# Patient Record
Sex: Female | Born: 1990 | Race: White | Hispanic: No
Health system: Midwestern US, Academic
[De-identification: ages and names within clinical notes are randomized; demographics above are authoritative.]

---

## 2015-05-22 ENCOUNTER — Emergency Department (HOSPITAL_COMMUNITY): Payer: Self-pay

## 2015-05-22 ENCOUNTER — Emergency Department (HOSPITAL_COMMUNITY)
Admission: EM | Admit: 2015-05-22 | Discharge: 2015-05-22 | Disposition: A | Discharge status: Home / Self Care | Payer: Self-pay | Attending: Emergency Medicine | Admitting: Emergency Medicine

## 2015-05-22 ENCOUNTER — Encounter (HOSPITAL_COMMUNITY): Payer: Self-pay | Admitting: Oncology

## 2015-05-22 DIAGNOSIS — Y990 Civilian activity done for income or pay: Secondary | ICD-10-CM | POA: Insufficient documentation

## 2015-05-22 DIAGNOSIS — Y9389 Activity, other specified: Secondary | ICD-10-CM | POA: Insufficient documentation

## 2015-05-22 DIAGNOSIS — W228XXA Striking against or struck by other objects, initial encounter: Secondary | ICD-10-CM | POA: Insufficient documentation

## 2015-05-22 DIAGNOSIS — S0083XA Contusion of other part of head, initial encounter: Secondary | ICD-10-CM | POA: Insufficient documentation

## 2015-05-22 DIAGNOSIS — Y9289 Other specified places as the place of occurrence of the external cause: Secondary | ICD-10-CM | POA: Insufficient documentation

## 2015-05-22 MED ORDER — IBUPROFEN 600 MG PO TABS: 600.0000 mg | ORAL_TABLET | Freq: Four times a day (QID) | ORAL | Status: DC | PRN

## 2015-05-22 MED ORDER — IBUPROFEN 800 MG PO TABS
800.0000 mg | ORAL_TABLET | Freq: Once | ORAL | Status: AC
  Administered 2015-05-22: 800 mg via ORAL
  Filled 2015-05-22: qty 1

## 2015-05-22 NOTE — ED Provider Notes (Signed)
CSN: [id number]     Arrival date & time 05/22/15  2003 History  By signing my name below, I, Lyndel Safe, attest that this documentation has been prepared under the direction and in the presence of Elpidio Anis, PA-C.  Electronically Signed: Lyndel Safe, ED Scribe. 05/22/2015. 9:47 PM.   Chief Complaint  Patient presents with  . Nose pain    The history is provided by the patient. No language interpreter was used.   HPI Comments: Sandra Carney is a 25 y.o. female, with no chronic medical conditions, who presents to the Emergency Department complaining of intermittent, 7/10 pain to bridge of nose s/p injury that occurred ~ 3 hours ago. Pt reports a pallet slid partially off a truck and hit her in the nose. She associates right nares epistaxis at time of injury which has resolved. She denies LOC, malocclusion, visual disturbances, or pain with EOMs. Pt is not taking blood thinning medication. No h/o tobacco abuse.   History reviewed. No pertinent past medical history. History reviewed. No pertinent past surgical history. History reviewed. No pertinent family history. Social History  Substance Use Topics  . Smoking status: Never Smoker   . Smokeless tobacco: Never Used  . Alcohol Use: No   OB History    No data available     Review of Systems  HENT: Negative for dental problem.   Eyes: Negative for pain and visual disturbance.  Musculoskeletal: Positive for arthralgias ( bridge of nose).  Neurological: Negative for syncope.  Hematological: Does not bruise/bleed easily.   Allergies  Review of patient's allergies indicates no known allergies.  Home Medications   Prior to Admission medications   Not on File   BP 131/68 mmHg  Pulse 83  Temp(Src) 98.8 F (37.1 C) (Oral)  Resp 18  SpO2 100%  LMP 04/20/2015 (Approximate) Physical Exam  Constitutional: She is oriented to person, place, and time. She appears well-developed and well-nourished. No distress.  HENT:  Head:  Normocephalic.  Minimal swelling at the bridge of the nose without deviation or deformity. Nares patent bilaterally. No septal hematoma visualized. No epistaxis. No other facial bone tenderness. No malocclusion.  Eyes: Conjunctivae and EOM are normal. Pupils are equal, round, and reactive to light.  No pain with full eye movement bilaterally.   Neck: Normal range of motion. Neck supple.  Cardiovascular: Normal rate.   Pulmonary/Chest: Effort normal. No respiratory distress.  Musculoskeletal: Normal range of motion.  No midline cervical tenderness.   Neurological: She is alert and oriented to person, place, and time. Coordination normal.  Skin: Skin is warm.  Psychiatric: She has a normal mood and affect. Her behavior is normal.  Nursing note and vitals reviewed.   ED Course  Procedures  DIAGNOSTIC STUDIES: Oxygen Saturation is 100% on RA, normal by my interpretation.    COORDINATION OF CARE: 9:44 PM Discussed treatment plan with pt at bedside to order Xray of nasal bones. Pt agreeable to plan.   Imaging Review Dg Nasal Bones  05/22/2015  CLINICAL DATA:  25 year old female with trauma the nose EXAM: NASAL BONES - 3+ VIEW COMPARISON:  None. FINDINGS: There is no evidence of fracture or other bone abnormality. IMPRESSION: No fracture. Electronically Signed   By: Elgie Collard M.D.   On: 05/22/2015 22:03   I have personally reviewed and evaluated these images results as part of my medical decision-making.   MDM   Final diagnoses:  None    1. Facial contusion  No fracture on nasal plain  film. She is otherwise NAD. Supportive care recommended.   I personally performed the services described in this documentation, which was scribed in my presence. The recorded information has been reviewed and is accurate.     Elpidio Anis, PA-C 05/22/15 2234  Mancel Bale, MD 05/22/15 [phone]

## 2015-05-22 NOTE — ED Notes (Signed)
Workers comp drug screen completed.

## 2015-05-22 NOTE — ED Notes (Signed)
Pt presents d/t an injury at work.  Per pt she had a pallet slide partially off a truck and hit her in the nose.  No obvious deformity.  Pt rates pain 7/10.  Nose did bleed at first per pt however no bleeding noted now.

## 2015-05-22 NOTE — Discharge Instructions (Signed)
Facial or Scalp Contusion °A facial or scalp contusion is a deep bruise on the face or head. Injuries to the face and head generally cause a lot of swelling, especially around the eyes. Contusions are the result of an injury that caused bleeding under the skin. The contusion may turn blue, purple, or yellow. Minor injuries will give you a painless contusion, but more severe contusions may stay painful and swollen for a few weeks.  °CAUSES  °A facial or scalp contusion is caused by a blunt injury or trauma to the face or head area.  °SIGNS AND SYMPTOMS  °· Swelling of the injured area.   °· Discoloration of the injured area.   °· Tenderness, soreness, or pain in the injured area.   °DIAGNOSIS  °The diagnosis can be made by taking a medical history and doing a physical exam. An X-ray exam, CT scan, or MRI may be needed to determine if there are any associated injuries, such as broken bones (fractures). °TREATMENT  °Often, the best treatment for a facial or scalp contusion is applying cold compresses to the injured area. Over-the-counter medicines may also be recommended for pain control.  °HOME CARE INSTRUCTIONS  °· Only take over-the-counter or prescription medicines as directed by your health care provider.   °· Apply ice to the injured area.   °· Put ice in a plastic bag.   °· Place a towel between your skin and the bag.   °· Leave the ice on for 20 minutes, 2-3 times a day.   °SEEK MEDICAL CARE IF: °· You have bite problems.   °· You have pain with chewing.   °· You are concerned about facial defects. °SEEK IMMEDIATE MEDICAL CARE IF: °· You have severe pain or a headache that is not relieved by medicine.   °· You have unusual sleepiness, confusion, or personality changes.   °· You throw up (vomit).   °· You have a persistent nosebleed.   °· You have double vision or blurred vision.   °· You have fluid drainage from your nose or ear.   °· You have difficulty walking or using your arms or legs.   °MAKE SURE YOU:   °· Understand these instructions. °· Will watch your condition. °· Will get help right away if you are not doing well or get worse. °  °This information is not intended to replace advice given to you by your health care provider. Make sure you discuss any questions you have with your health care provider. °  °Document Released: 05/25/2004 Document Revised: 05/08/2014 Document Reviewed: 11/28/2012 °Elsevier Interactive Patient Education ©2016 Elsevier Inc. ° °Cryotherapy °Cryotherapy means treatment with cold. Ice or gel packs can be used to reduce both pain and swelling. Ice is the most helpful within the first 24 to 48 hours after an injury or flare-up from overusing a muscle or joint. Sprains, strains, spasms, burning pain, shooting pain, and aches can all be eased with ice. Ice can also be used when recovering from surgery. Ice is effective, has very few side effects, and is safe for most people to use. °PRECAUTIONS  °Ice is not a safe treatment option for people with: °· Raynaud phenomenon. This is a condition affecting small blood vessels in the extremities. Exposure to cold may cause your problems to return. °· Cold hypersensitivity. There are many forms of cold hypersensitivity, including: °¨ Cold urticaria. Red, itchy hives appear on the skin when the tissues begin to warm after being iced. °¨ Cold erythema. This is a red, itchy rash caused by exposure to cold. °¨ Cold hemoglobinuria. Red blood   cells break down when the tissues begin to warm after being iced. The hemoglobin that carry oxygen are passed into the urine because they cannot combine with blood proteins fast enough. °· Numbness or altered sensitivity in the area being iced. °If you have any of the following conditions, do not use ice until you have discussed cryotherapy with your caregiver: °· Heart conditions, such as arrhythmia, angina, or chronic heart disease. °· High blood pressure. °· Healing wounds or open skin in the area being  iced. °· Current infections. °· Rheumatoid arthritis. °· Poor circulation. °· Diabetes. °Ice slows the blood flow in the region it is applied. This is beneficial when trying to stop inflamed tissues from spreading irritating chemicals to surrounding tissues. However, if you expose your skin to cold temperatures for too long or without the proper protection, you can damage your skin or nerves. Watch for signs of skin damage due to cold. °HOME CARE INSTRUCTIONS °Follow these tips to use ice and cold packs safely. °· Place a dry or damp towel between the ice and skin. A damp towel will cool the skin more quickly, so you may need to shorten the time that the ice is used. °· For a more rapid response, add gentle compression to the ice. °· Ice for no more than 10 to 20 minutes at a time. The bonier the area you are icing, the less time it will take to get the benefits of ice. °· Check your skin after 5 minutes to make sure there are no signs of a poor response to cold or skin damage. °· Rest 20 minutes or more between uses. °· Once your skin is numb, you can end your treatment. You can test numbness by very lightly touching your skin. The touch should be so light that you do not see the skin dimple from the pressure of your fingertip. When using ice, most people will feel these normal sensations in this order: cold, burning, aching, and numbness. °· Do not use ice on someone who cannot communicate their responses to pain, such as small children or people with dementia. °HOW TO MAKE AN ICE PACK °Ice packs are the most common way to use ice therapy. Other methods include ice massage, ice baths, and cryosprays. Muscle creams that cause a cold, tingly feeling do not offer the same benefits that ice offers and should not be used as a substitute unless recommended by your caregiver. °To make an ice pack, do one of the following: °· Place crushed ice or a bag of frozen vegetables in a sealable plastic bag. Squeeze out the excess  air. Place this bag inside another plastic bag. Slide the bag into a pillowcase or place a damp towel between your skin and the bag. °· Mix 3 parts water with 1 part rubbing alcohol. Freeze the mixture in a sealable plastic bag. When you remove the mixture from the freezer, it will be slushy. Squeeze out the excess air. Place this bag inside another plastic bag. Slide the bag into a pillowcase or place a damp towel between your skin and the bag. °SEEK MEDICAL CARE IF: °· You develop white spots on your skin. This may give the skin a blotchy (mottled) appearance. °· Your skin turns blue or pale. °· Your skin becomes waxy or hard. °· Your swelling gets worse. °MAKE SURE YOU:  °· Understand these instructions. °· Will watch your condition. °· Will get help right away if you are not doing well or get worse. °  °  This information is not intended to replace advice given to you by your health care provider. Make sure you discuss any questions you have with your health care provider. °  °Document Released: 12/12/2010 Document Revised: 05/08/2014 Document Reviewed: 12/12/2010 °Elsevier Interactive Patient Education ©2016 Elsevier Inc. ° °

## 2016-12-15 IMAGING — CR DG NASAL BONES 3+V
3 series · 3 of 3 positions shown · non-contrast
Comparison: None.

CLINICAL DATA: 24-year-old female with trauma the nose

EXAM:
NASAL BONES - 3+ VIEW

[w waters pa]
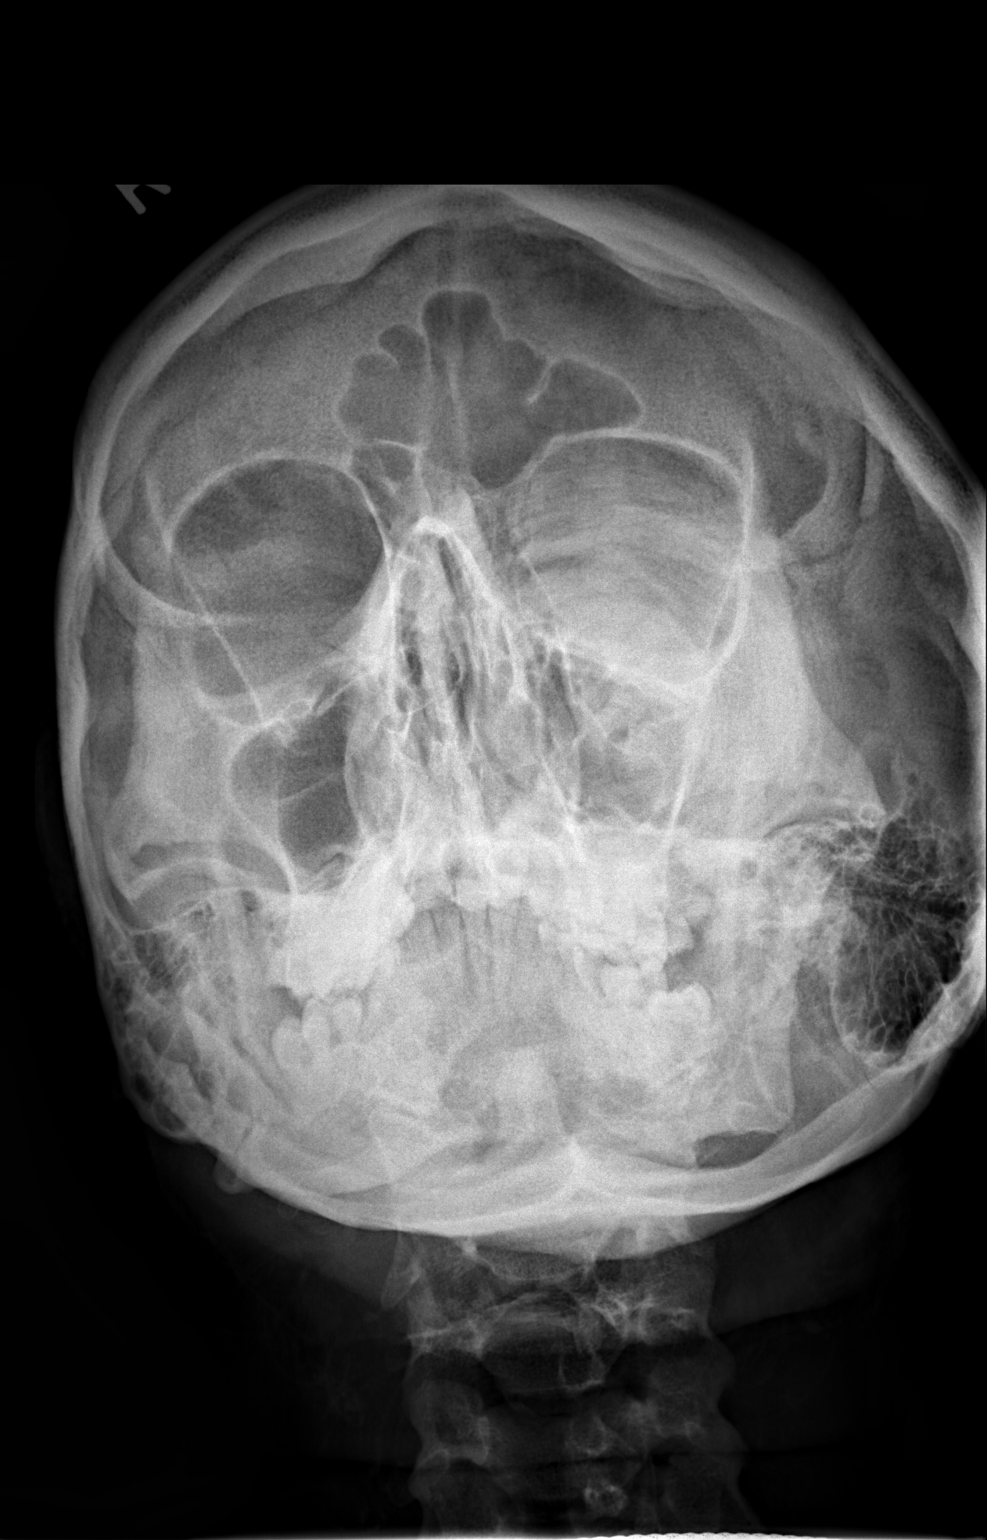

[w nasal bone lat (1 of 2)]
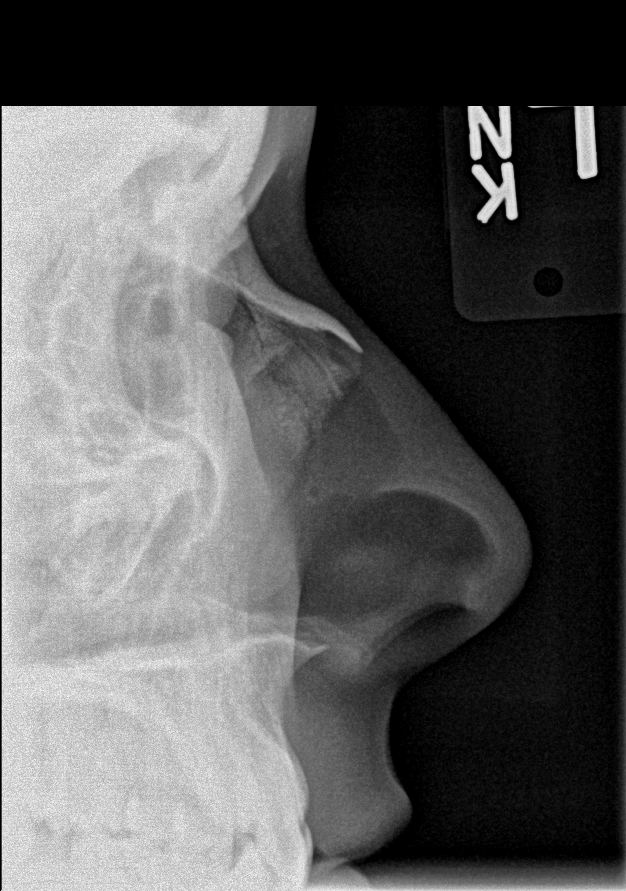

[w nasal bone lat (2 of 2)]
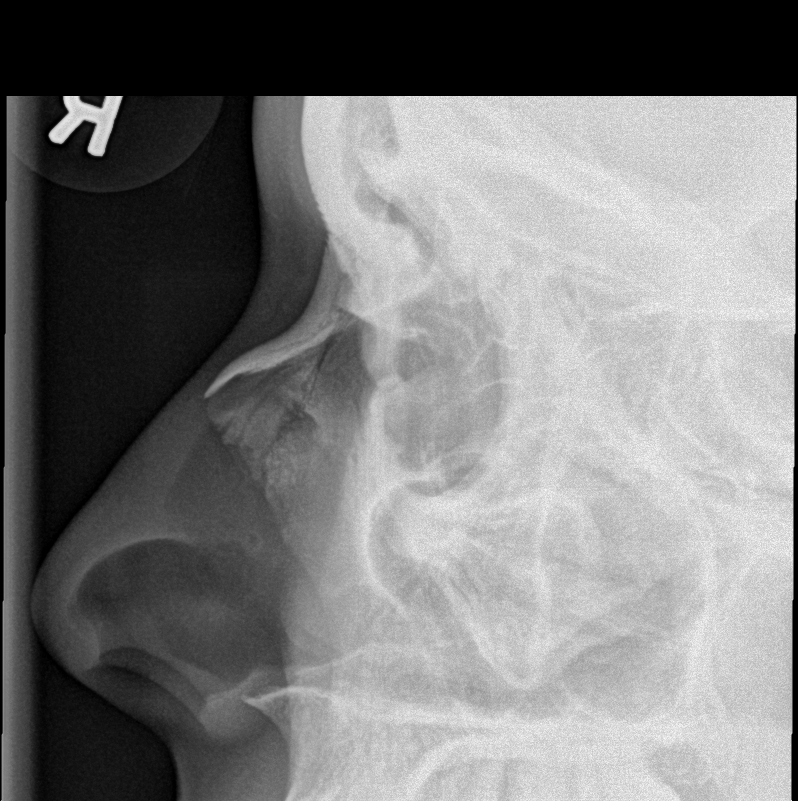

[3 of 3 positions shown; findings below may reference images not displayed]

FINDINGS: There is no evidence of fracture or other bone abnormality.
IMPRESSION: No fracture.

## 2016-12-17 ENCOUNTER — Encounter (HOSPITAL_COMMUNITY): Payer: Self-pay | Admitting: Emergency Medicine

## 2016-12-17 ENCOUNTER — Emergency Department (HOSPITAL_COMMUNITY)
Admission: EM | Admit: 2016-12-17 | Discharge: 2016-12-17 | Disposition: A | Discharge status: Home / Self Care | Payer: Self-pay | Attending: Emergency Medicine | Admitting: Emergency Medicine

## 2016-12-17 DIAGNOSIS — L239 Allergic contact dermatitis, unspecified cause: Secondary | ICD-10-CM | POA: Insufficient documentation

## 2016-12-17 MED ORDER — PREDNISONE 10 MG PO TABS: ORAL_TABLET | ORAL | 0 refills | Status: DC

## 2016-12-17 NOTE — Discharge Instructions (Signed)
Return if any problems.

## 2016-12-17 NOTE — ED Notes (Signed)
Patient was alert, oriented and stable upon discharge. RN went over AVS and patient had no further questions.  

## 2016-12-17 NOTE — ED Provider Notes (Signed)
[facility]-EMERGENCY DEPT Provider Note   CSN: [id number] Arrival date & time: 12/17/16  1731  By signing my name below, I, Ny'Kea Lewis, attest that this documentation has been prepared under the direction and in the presence of Langston Masker, [place]. Electronically Signed: Karren Cobble, ED Scribe. 12/17/16. 7:07 PM.  History   Chief Complaint No chief complaint on file.  The history is provided by the patient. No language interpreter was used.    HPI Comments: Sandra Carney is a 26 y.o. female with no pertinent history, who presents to the Emergency Department complaining of sudden onset, mild diffuse itching and areas of redness to her feet. 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 Pt states she has tried several topical creams and rubbing alcohol to with no improvement of her symptoms. Denies fever, chills, or any other complaints.   History reviewed. No pertinent past medical history.  There are no active problems to display for this patient.  History reviewed. No pertinent surgical history.  OB History    No data available     Home Medications    Prior to Admission medications   Medication Sig Start Date End Date Taking? Authorizing Provider  ibuprofen (ADVIL,MOTRIN) 600 MG tablet Take 1 tablet (600 mg total) by mouth every 6 (six) hours as needed. 05/22/15   Elpidio Anis, PA-C   Family History No family history on file.  Social History Social History  Substance Use Topics  . Smoking status: Never Smoker  . Smokeless tobacco: Never Used  . Alcohol use No   Allergies   Patient has no known allergies.  Review of Systems Review of Systems  Constitutional: Negative for chills and fever.  Skin: Positive for rash.  All other systems reviewed and are negative.   Physical Exam Updated Vital Signs BP 121/70 (BP Location: Right Arm)   Pulse 70   Temp 97.8 F (36.6 C) (Oral)   Resp 18   Ht 5\' 6"  (1.676 m)   Wt 180 lb (81.6 kg)    LMP 12/10/2016   SpO2 100%   BMI 29.05 kg/m   Physical Exam  Constitutional: She is oriented to person, place, and time. She appears well-developed and well-nourished.  HENT:  Head: Normocephalic.  Eyes: EOM are normal.  Neck: Normal range of motion.  Pulmonary/Chest: Effort normal.  Abdominal: She exhibits no distension.  Musculoskeletal: Normal range of motion.  Neurological: She is alert and oriented to person, place, and time.  Skin: There is erythema.  Multiple scattered erythematous raised pimples.   Psychiatric: She has a normal mood and affect.  Nursing note and vitals reviewed.  ED Treatments / Results  DIAGNOSTIC STUDIES: Oxygen Saturation is 100% on RA, normal by my interpretation.  COORDINATION OF CARE: 6:57 PM-Discussed next steps with pt. Pt verbalized understanding and is agreeable with the plan.   Labs (all labs ordered are listed, but only abnormal results are displayed) Labs Reviewed - No data to display  EKG  EKG Interpretation None       Radiology No results found.  Procedures Procedures (including critical care time)  Medications Ordered in ED Medications - No data to display   Initial Impression / Assessment and Plan / ED Course  I have reviewed the triage vital signs and the nursing notes.  Pertinent labs & imaging results that were available during my care of the patient were reviewed by me and considered in my medical decision making (see chart for details).  Final Clinical Impressions(s) / ED Diagnoses   Final diagnoses:  Allergic dermatitis    New Prescriptions Discharge Medication List as of 12/17/2016  7:10 PM    START taking these medications   Details  predniSONE (DELTASONE) 10 MG tablet 6,5,4,3,2,1 taper, Print      An After Visit Summary was printed and given to the patient.  I personally performed the services in this documentation, which was scribed in my presence.  The recorded information has been  reviewed and considered.   Barnet Pall.   Elson Areas, [place] 12/17/16 2233    Geoffery Lyons, MD 12/17/16 2259

## 2016-12-17 NOTE — ED Triage Notes (Addendum)
Pt comes in with complaints of a rash present on both of her feet without spreading to her legs. Did have some on her arms but those are healing.  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  Pt has tried several topical creams without relief.

## 2017-01-17 ENCOUNTER — Ambulatory Visit (INDEPENDENT_AMBULATORY_CARE_PROVIDER_SITE_OTHER): Payer: Self-pay

## 2017-01-17 ENCOUNTER — Ambulatory Visit (HOSPITAL_COMMUNITY)
Admission: EM | Admit: 2017-01-17 | Discharge: 2017-01-17 | Disposition: A | Discharge status: Home / Self Care | Payer: Self-pay | Attending: Family Medicine | Admitting: Family Medicine

## 2017-01-17 ENCOUNTER — Encounter (HOSPITAL_COMMUNITY): Payer: Self-pay | Admitting: Family Medicine

## 2017-01-17 DIAGNOSIS — S9031XA Contusion of right foot, initial encounter: Secondary | ICD-10-CM

## 2017-01-17 NOTE — ED Provider Notes (Signed)
  [facility]   [id number] 01/17/17 Arrival Time: 1620  ASSESSMENT & PLAN:  1. Contusion of right foot, initial encounter    Post op shoe until she can tolerate a normal shoe. Activity as tolerated. Ibuprofen with food as needed. May f/u as needed. Reviewed expectations re: course of current medical issues. Questions answered. Outlined signs and symptoms indicating need for more acute intervention. Patient verbalized understanding. After Visit Summary given.   SUBJECTIVE:  Sandra Carney is a 26 y.o. female who presents with complaint of an injury to her R foot yesterday evening. Heavy cart rolled onto foot. Able to pull foot free. Continued with ambulation. Gradual onset of discomfort near great toe. Bruised this morning. Still ambulatory. Can wear a flip-flop. Ibuprofen helps. No sensation changes.  ROS: As per HPI.   OBJECTIVE:  Vitals:   01/17/17 1653  BP: 124/84  Pulse: 76  Resp: 18  Temp: 98.4 F (36.9 C)  SpO2: 100%    General appearance: alert; no distress Extremities: swelling of R foot at great toe; tender to palpation that is poorly localized; normal capillary refill; normal sensation Skin: bruised over dorsal R foot just proximal to great toe Psychological: alert and cooperative; normal mood and affect  Imaging: Dg Foot Complete Right  Result Date: 01/17/2017 CLINICAL DATA:  Injury, pain EXAM: RIGHT FOOT COMPLETE - 3+ VIEW COMPARISON:  None. FINDINGS: There is no evidence of fracture or dislocation. There is no evidence of arthropathy or other focal bone abnormality. Soft tissues are unremarkable. IMPRESSION: Negative. Electronically Signed   By: Judie Petit.  Shick M.D.   On: 01/17/2017 17:13    No Known Allergies  Social History   Social History  . Marital status: Single    Spouse name: N/A  . Number of children: N/A  . Years of education: N/A   Occupational History  . Not on file.   Social History Main Topics  . Smoking status: Never Smoker  .  Smokeless tobacco: Never Used  . Alcohol use No  . Drug use: No  . Sexual activity: Yes    Birth control/ protection: None   Other Topics Concern  . Not on file   Social History Narrative  . No narrative on file      Mardella Layman, MD 01/17/17 1728

## 2017-01-17 NOTE — ED Triage Notes (Signed)
Pt here for right foot pain around and right great toe. Swelling and bruising noted. sts injured at a concert last night

## 2017-01-17 NOTE — Discharge Instructions (Signed)
Wear shoe until you can tolerate a normal shoe. Use ibuprofen and Tylenol as needed.

## 2017-06-06 ENCOUNTER — Emergency Department (HOSPITAL_COMMUNITY)
Admission: EM | Admit: 2017-06-06 | Discharge: 2017-06-06 | Disposition: A | Discharge status: Home / Self Care | Payer: Self-pay | Attending: Emergency Medicine | Admitting: Emergency Medicine

## 2017-06-06 ENCOUNTER — Encounter (HOSPITAL_COMMUNITY): Payer: Self-pay | Admitting: *Deleted

## 2017-06-06 DIAGNOSIS — M62830 Muscle spasm of back: Secondary | ICD-10-CM | POA: Insufficient documentation

## 2017-06-06 DIAGNOSIS — S39012A Strain of muscle, fascia and tendon of lower back, initial encounter: Secondary | ICD-10-CM | POA: Insufficient documentation

## 2017-06-06 DIAGNOSIS — Y9389 Activity, other specified: Secondary | ICD-10-CM | POA: Insufficient documentation

## 2017-06-06 DIAGNOSIS — Y929 Unspecified place or not applicable: Secondary | ICD-10-CM | POA: Insufficient documentation

## 2017-06-06 DIAGNOSIS — X500XXA Overexertion from strenuous movement or load, initial encounter: Secondary | ICD-10-CM | POA: Insufficient documentation

## 2017-06-06 DIAGNOSIS — Y999 Unspecified external cause status: Secondary | ICD-10-CM | POA: Insufficient documentation

## 2017-06-06 MED ORDER — CYCLOBENZAPRINE HCL 10 MG PO TABS: 10.0000 mg | ORAL_TABLET | Freq: Two times a day (BID) | ORAL | 0 refills | Status: AC | PRN

## 2017-06-06 MED ORDER — NAPROXEN 500 MG PO TABS: 500.0000 mg | ORAL_TABLET | Freq: Two times a day (BID) | ORAL | 0 refills | Status: AC

## 2017-06-06 MED ORDER — CYCLOBENZAPRINE HCL 10 MG PO TABS
10.0000 mg | ORAL_TABLET | Freq: Once | ORAL | Status: AC
  Administered 2017-06-06: 10 mg via ORAL
  Filled 2017-06-06: qty 1

## 2017-06-06 NOTE — ED Triage Notes (Signed)
Pt complains of lower back pain for the past 2 days since picking up heavy logs.   Pt tried motrin and heating pads, which she states provided some relief.

## 2017-06-06 NOTE — ED Provider Notes (Signed)
[facility] Provider Note   CSN: [id number] Arrival date & time: 06/06/17  0856     History   Chief Complaint Chief Complaint  Patient presents with  . Back Pain    HPI Sandra Carney is a 27 y.o. female who presents to the ED with low back pain that started 2 days ago after picking up heavy logs. Patient reports taking motrin and using a heating pad that did help some. Patient denies loss of control of bladder or bowels. Patient denies UTI symptoms, n/v or any other problems.   HPI  History reviewed. No pertinent past medical history.  There are no active problems to display for this patient.   History reviewed. No pertinent surgical history.  OB History    No data available       Home Medications    Prior to Admission medications   Medication Sig Start Date End Date Taking? Authorizing Provider  cyclobenzaprine (FLEXERIL) 10 MG tablet Take 1 tablet (10 mg total) by mouth 2 (two) times daily as needed for muscle spasms. 06/06/17   Janne Napoleon, NP  naproxen (NAPROSYN) 500 MG tablet Take 1 tablet (500 mg total) by mouth 2 (two) times daily. 06/06/17   Janne Napoleon, NP    Family History No family history on file.  Social History Social History   Tobacco Use  . Smoking status: Never Smoker  . Smokeless tobacco: Never Used  Substance Use Topics  . Alcohol use: No  . Drug use: No     Allergies   Patient has no known allergies.   Review of Systems Review of Systems  Musculoskeletal: Positive for arthralgias and back pain.  All other systems reviewed and are negative.    Physical Exam Updated Vital Signs BP 127/79 (BP Location: Right Arm)   Pulse (!) 58   Temp 97.8 F (36.6 C) (Oral)   Resp 18   SpO2 100%   Physical Exam  Constitutional: She appears well-developed and well-nourished. No distress.  HENT:  Head: Normocephalic and atraumatic.  Nose: Nose normal.  Eyes: EOM are normal.  Neck: Normal range of  motion. Neck supple.  Cardiovascular: Normal rate and regular rhythm.  Pulmonary/Chest: Effort normal. She has no wheezes. She has no rales.  Abdominal: Soft. There is no tenderness.  Musculoskeletal:       Lumbar back: She exhibits tenderness, pain and spasm. She exhibits normal pulse. Decreased range of motion: due to pain.       Back:  Neurological: She is alert. She has normal strength. No sensory deficit. Gait normal.  Reflex Scores:      Bicep reflexes are 2+ on the right side and 2+ on the left side.      Brachioradialis reflexes are 2+ on the right side and 2+ on the left side.      Patellar reflexes are 2+ on the right side and 2+ on the left side. Skin: Skin is warm and dry.  Psychiatric: She has a normal mood and affect. Her behavior is normal.  Nursing note and vitals reviewed.    ED Treatments / Results  Labs (all labs ordered are listed, but only abnormal results are displayed) Labs Reviewed - No data to display Radiology No results found.  Procedures Procedures (including critical care time)  Medications Ordered in ED Medications  cyclobenzaprine (FLEXERIL) tablet 10 mg (10 mg Oral Given 06/06/17 1250)     Initial Impression / Assessment and Plan / ED Course  I have reviewed the triage vital signs and the nursing notes. Patient with back pain.  No neurological deficits and normal neuro exam.  Patient can walk but states is painful.  No loss of bowel or bladder control.  No concern for cauda equina.  No fever, night sweats, weight loss, h/o cancer, IVDU.  RICE protocol and pain medicine indicated and discussed with patient.   Final Clinical Impressions(s) / ED Diagnoses   Final diagnoses:  Muscle spasm of back  Lumbosacral strain, initial encounter    ED Discharge Orders        Ordered    naproxen (NAPROSYN) 500 MG tablet  2 times daily     06/06/17 1314    cyclobenzaprine (FLEXERIL) 10 MG tablet  2 times daily PRN     06/06/17 1314       Damian Leavelleese, [place]  M, NP 06/06/17 1321    Bethann BerkshireZammit, Joseph, MD 06/06/17 1626

## 2017-06-06 NOTE — Discharge Instructions (Signed)
Do not take the muscle relaxer while driving as it will make you sleepy. Follow up with your doctor or return here if symptoms worsen.

## 2018-08-13 IMAGING — DX DG FOOT COMPLETE 3+V*R*
3 series · 3 of 3 positions shown · non-contrast
Comparison: None.

CLINICAL DATA: Injury, pain

EXAM:
RIGHT FOOT COMPLETE - 3+ VIEW

[foot ap]
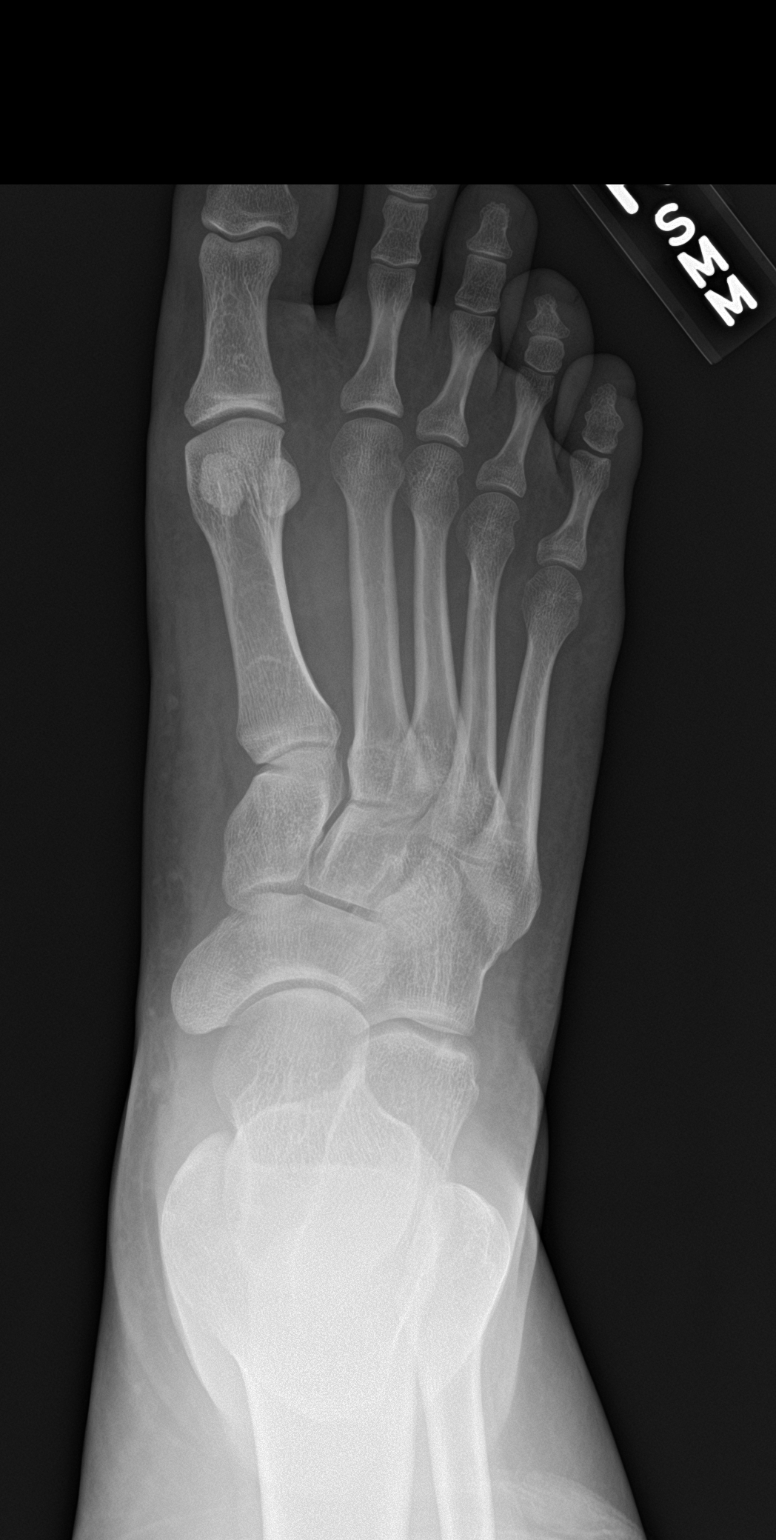

[foot obl]
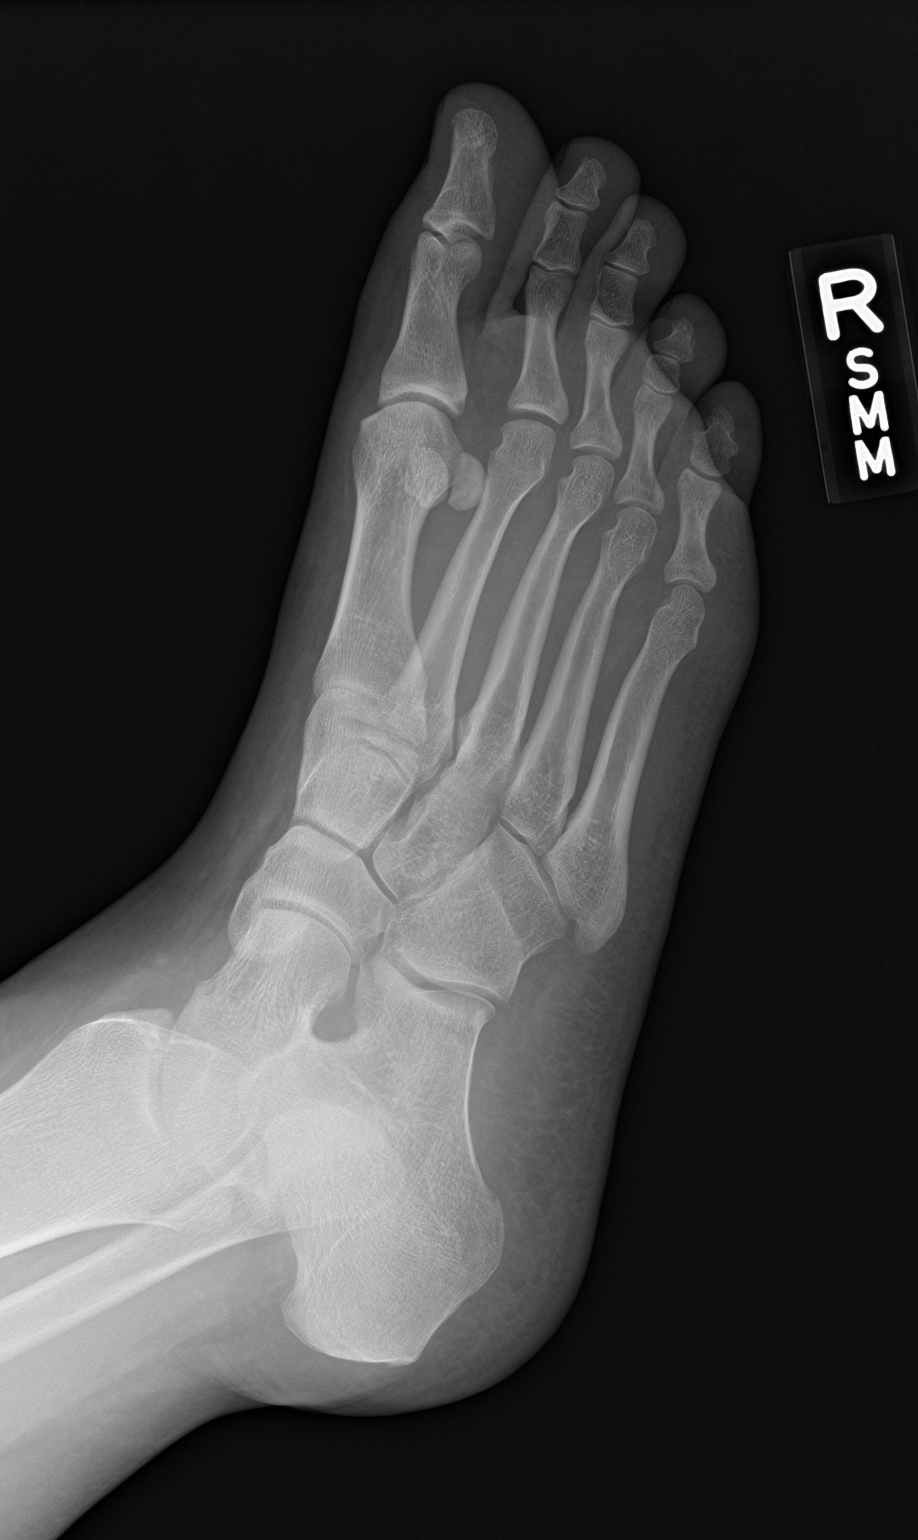

[foot lat]
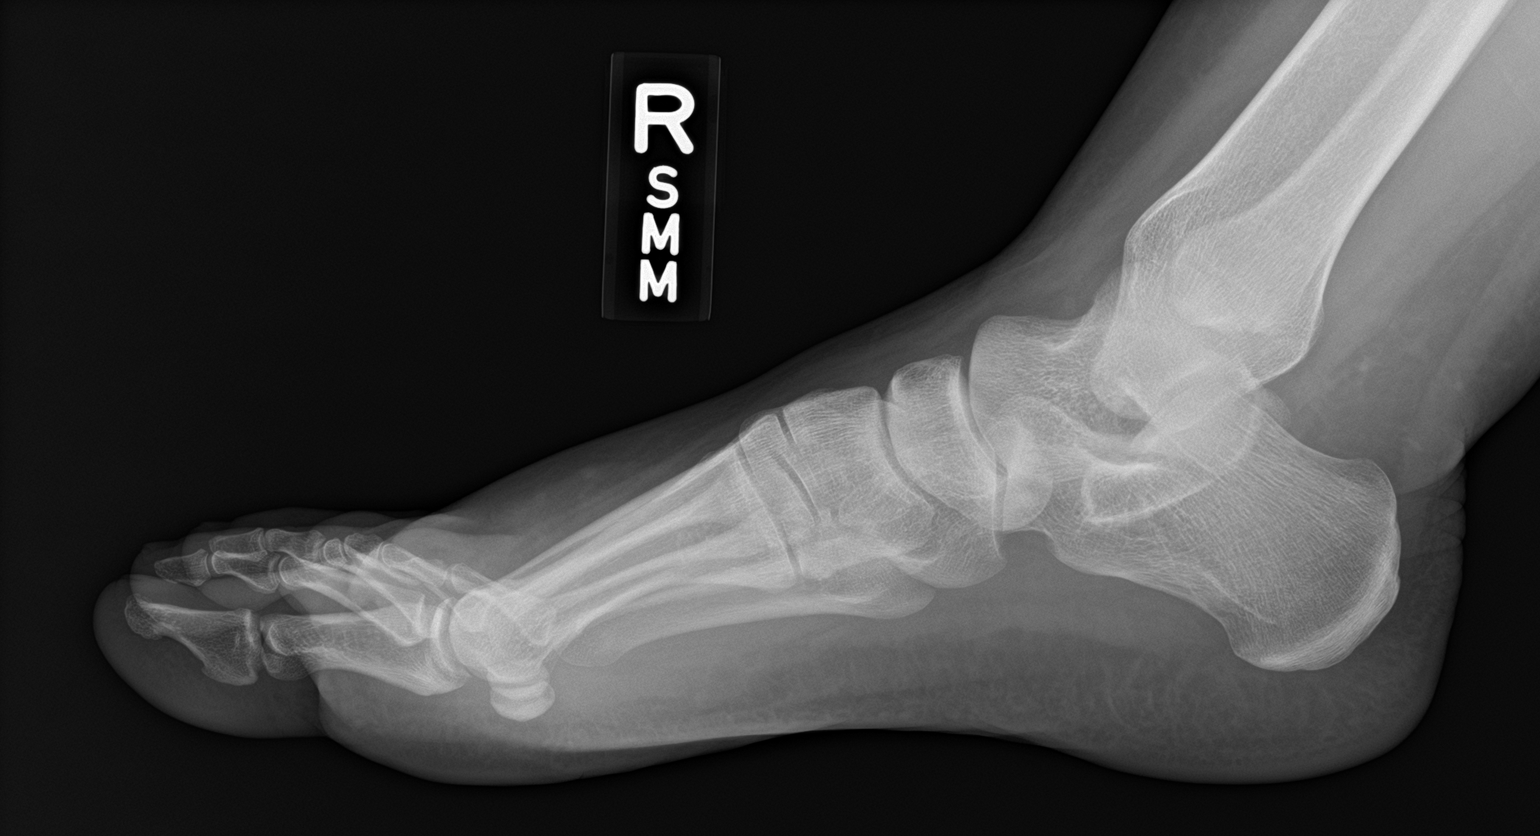

[3 of 3 positions shown; findings below may reference images not displayed]

FINDINGS: There is no evidence of fracture or dislocation. There is no
evidence of arthropathy or other focal bone abnormality. Soft
tissues are unremarkable.
IMPRESSION: Negative.

## 2021-10-14 ENCOUNTER — Ambulatory Visit: Admit: 2021-10-14 | Payer: PRIVATE HEALTH INSURANCE

## 2021-10-14 ENCOUNTER — Ambulatory Visit: Admit: 2021-10-14 | Discharge: 2021-10-25 | Payer: PRIVATE HEALTH INSURANCE

## 2021-10-14 ENCOUNTER — Other Ambulatory Visit: Admit: 2021-10-14 | Discharge: 2021-10-20 | Payer: PRIVATE HEALTH INSURANCE | Attending: Women's Health

## 2021-10-14 DIAGNOSIS — O0993 Supervision of high risk pregnancy, unspecified, third trimester: Secondary | ICD-10-CM

## 2021-10-14 DIAGNOSIS — O0933 Supervision of pregnancy with insufficient antenatal care, third trimester: Secondary | ICD-10-CM

## 2021-10-14 LAB — POC HCG QUALITATIVE, URINE: hCG Qualitative -Clinitek: POSITIVE

## 2021-10-14 LAB — RUBELLA IMMUNE STATUS
RUB NUM: 3.26 INDEX (ref 0.0–0.8)
Rubella IgG Scr: POSITIVE

## 2021-10-14 LAB — CBC
Hematocrit: 32.3 % (ref 35.0–45.0)
Hemoglobin: 10.9 g/dL (ref 11.7–15.5)
MCH: 28.8 pg (ref 27.0–33.0)
MCHC: 33.7 g/dL (ref 32.0–36.0)
MCV: 85.4 fL (ref 80.0–100.0)
MPV: 9.4 fL (ref 7.5–11.5)
Platelets: 271 10*3/uL (ref 140–400)
RBC: 3.78 10*6/uL (ref 3.80–5.10)
RDW: 14.2 % (ref 11.0–15.0)
WBC: 15.4 10E3/uL — ABNORMAL HIGH (ref 3.8–10.8)

## 2021-10-14 LAB — HEPATITIS C ANTIBODY: HCV Ab: NONREACTIVE

## 2021-10-14 LAB — GLUCOSE CHALLENGE, 1 HOUR: Glucose Challenge, 1 Hr: 91 mg/dL

## 2021-10-14 LAB — URINE CULTURE, OUTPATIENT

## 2021-10-14 LAB — CHLAMYDIA / GONORRHOEAE DNA URINE
Chlamydia Trachomatis DNA Urine: NEGATIVE
Neisseria gonorrhoeae DNA Urine: NEGATIVE

## 2021-10-14 LAB — HEMOGLOBINOPATHY EVALUATION
Hgb A2 Quant: 2.4 %
Hgb A: 97.6 %

## 2021-10-14 LAB — TREPONEMA PALLIDUM AB WITH REFLEX: Treponema Pallidum: NEGATIVE

## 2021-10-14 LAB — ABO/RH: Rh Type: POSITIVE

## 2021-10-14 LAB — ANTIBODY SCREEN: Antibody Screen: NEGATIVE

## 2021-10-14 LAB — HEPATITIS B SURFACE ANTIGEN: Hep B Surface Ag: NONREACTIVE

## 2021-10-14 LAB — HIV 1+2 ANTIBODY/ANTIGEN WITH REFLEX: HIV 1+2 AB/AGN: NONREACTIVE

## 2021-10-14 MED ORDER — famotidine (PEPCID) 20 MG tablet
20 | ORAL_TABLET | Freq: Two times a day (BID) | ORAL | 2 refills
Start: 2021-10-14 — End: 2021-11-17

## 2021-10-14 NOTE — Unmapped (Signed)
Ashley Schwartz   [id number]    Patient is being seen today for her first obstetrical visit with husband, Ashley Schwartz. This pregnancy was not planned, but welcomed.   G1P0   [redacted]w[redacted]d   EDD by LMP  Reports she has been seen at [facility] in [place] routinely for ultrasounds and reports EDD 11/26/21.      She vaginal bleeding, LOF  Denies contractions; notes occ mild cramping  Endorses fetal movement    BP normal today  Denies HA, vision changes, RUQ/epigastric pain, chest pain, SOB    Denies abnormal discharge, itching, odor  Denies bowel/bladder concerns    Reports nausea from reflux - drinking water and that helps.  States TUMS do not help.    Would like to try pepcid - will send rx.    Denies other symptoms/concerns    Current medications:  - taking PNV gummy - does not need refills at this time    Pregnancy history fully reviewed.    This is her first pregnancy.    OB History     Gravida   1    Para        Term        Preterm        AB        Living           SAB        IAB        Ectopic        Multiple        Live Births                      Denies family hx birth defects. Declines aneuploidy screening.    Denies family hx of diabetes in first degree relative.    Denies recent STI history, including genital HSV.     Denies medical history    Mental health history  - Anxiety  - Not on medications  - Uses essential oil which helps  - Denies concerns today  - Reviewed perinatal counseling and psychiatry services available    Denies surgical history    Reviewed recommendation for the following vaccinations in pregnancy.   COVID: reports she is s/p vaccine in [place]  TDAP: desires today    She plans to breast and bottle feed.     She denies tobacco use  Denies ETOH or illicit drug use.    The following portions of the patient's history were reviewed and updated as appropriate: allergies, current medications, past family history, past medical history, past social history, past surgical history and problem list.    Review of  Systems  Pertinent items are noted in HPI.       Objective:     BP 129/73 (BP Location: Right arm, Patient Position: Sitting, BP Cuff Size: Regular)    Pulse 112    Temp 98 ??F (36.7 ??C) (Oral)    Wt 217 lb 8 oz (98.7 kg)    LMP 02/12/2021 (Exact Date)     Physical Examination  Constitutional: alert, no acute distress, well hydrated, well developed  Skin: normal color, no rashes, no lesions, warm to touch;    Head: atraumatic, normocephalic;    Ears/Nose/Throat: external ears normal, hearing normal, external nose normal;    Neck: supple  Chest: no apparent respiratory distress, normal chest inspection   Cardiac:   regular rate   Abdomen: nondistended, nontender, no guarding, no masses, no organomegaly, no suprapubic tenderness;  FHT with fetal tachycardia and FH per flowsheet -> sent for NST  Back: no deformities, normal spine  Extremities: no edema, no lesions    Neuro: normal mental status   Psych: oriented to time; place; and person, affect and mood appropriate        Assessment:      Pregnancy at [redacted]w[redacted]d weeks   1. High-risk pregnancy in third trimester  POC HCG Qualitative, Urine    Tdap vaccine greater than or equal to 7yo IM    Chlamydia / Gonorrhoeae DNA Urine    ABO/Rh    Antibody screen    CBC    Rubella Immune Status    Syphilis Screening [name])    Urine Culture, Outpatient    Hepatitis B surface antigen    HIV 1+2 Antibody/Antigen with Reflex    Hemoglobinopathy evaluation    Glucose Challenge, 1 Hour    OB Imaging    Hepatitis C Antibody    Fetal nonstress test    BABYSCRIPTS MATERNAL DIGITAL EDUCATION      2. Late prenatal care        3. Heartburn during pregnancy in third trimester  famotidine (PEPCID) 20 MG tablet            Plan:     Discussed pregnancy expectations, weight gain, exercise and food avoidance. Oriented to practice. Reviewed covid virus, travel precautions.    ER precautions reviewed  BP normal range today - asymptomatic  NOB labs drawn. GC/CT today  Plan for PAP postpartum per patient's  request  Fetal tachycardia (170 on doppler) -> sent for NST following appointment  Continue PNV   TDAP today  GCT today with NOB labs  Planning to breast and bottle feed  Genetic counseling/testing discussed and information given. Declines at this time  Role of ultrasound in pregnancy discussed; anatomy US scheduled  Problem list reviewed and updated.  RTC in 1 weeks for ROB  Questions answered      Gretchen Portela, NP, WHNP-BC    [facility]   10/17/2021

## 2021-10-14 NOTE — Unmapped (Signed)
Patient is present for a new OB visit.  Patient does not currently smoke.  Breastfeeding options and lactation consultant support discussed. Patient does desire to breastfeed and bottlefeed.   Number for [facility] given to patient. Use of MyChart also discusssed with patient. Patient is active on MyChart.  Fluzone / Tdap / COVID immunizations discussed.   Prenatal labs to be drawn today.  Dental referral and resources provided.  Anatomy US:  10/26/21 at 1410, first available appointment pt could make  Return OB Appointment: 10/24/21 at 0745 with Dr Iona Hansen  Confirmation of pregnancy letter provided for patient.  AVS printed and given to patient. All questions answered.  Patient encouraged to contact [facility] with any further questions.    Alessandra Bevels RNC

## 2021-10-14 NOTE — Unmapped (Signed)
Baby 1  FHR Baseline: 140  Variability: moderate (6-25 beats per minute)  Acceleration: Present  Decelerations Episodic: no  Uterine Contractions: yes- denies  Irritability: no  Comments:   External fetal monitoring done and physician notified for interpretation: yes    Patient's last physician office visit is 10/14/21   Next physician office visit is 10/24/21    I have discussed with patient fetal kick counts and provided literature: yes  Importance of compliance with antenatal testing discussed:  yes    Performed by:  Wilfred Lacy

## 2021-10-14 NOTE — Unmapped (Signed)
Attending NST    FHT: 145, mod var, + accels, no decels  Toco; irritability    Reactive, reassuring for [redacted]w[redacted]d    Ashley Schwartz RENEE Manfred Laspina

## 2021-10-17 NOTE — Unmapped (Signed)
Chart was reviewed for perinatal case management. Pt does not medically risk in for case management services. Referral will be sent to CHW or SW to address social determinants of health. Please refer again as needed.

## 2021-10-24 ENCOUNTER — Inpatient Hospital Stay
Admit: 2021-10-24 | Discharge: 2021-10-24 | Disposition: A | Discharge status: Home / Self Care | Payer: PRIVATE HEALTH INSURANCE

## 2021-10-24 ENCOUNTER — Ambulatory Visit: Admit: 2021-10-24 | Payer: PRIVATE HEALTH INSURANCE

## 2021-10-24 DIAGNOSIS — O99283 Endocrine, nutritional and metabolic diseases complicating pregnancy, third trimester: Secondary | ICD-10-CM

## 2021-10-24 DIAGNOSIS — O0993 Supervision of high risk pregnancy, unspecified, third trimester: Secondary | ICD-10-CM

## 2021-10-24 LAB — URINALYSIS W/RFL TO MICROSCOPIC
Bilirubin, UA: NEGATIVE
Blood, UA: NEGATIVE
Glucose, UA: NEGATIVE mg/dL
Ketones, UA: NEGATIVE mg/dL
Leukocytes, UA: NEGATIVE
Nitrite, UA: NEGATIVE
Protein, UA: NEGATIVE mg/dL
RBC, UA: 2 /HPF (ref 0–3)
Specific Gravity, UA: 1.014 (ref 1.005–1.035)
Squam Epithel, UA: 8 /HPF (ref 0–5)
Urobilinogen, UA: 2 mg/dL (ref 0.2–1.9)
WBC, UA: 5 /HPF (ref 0–5)
pH, UA: 5 (ref 5.0–8.0)

## 2021-10-24 LAB — BASIC METABOLIC PANEL
Anion Gap: 12 mmol/L (ref 3–16)
BUN: 7 mg/dL (ref 7–25)
CO2: 21 mmol/L (ref 21–33)
Calcium: 8.8 mg/dL (ref 8.6–10.3)
Chloride: 104 mmol/L (ref 98–110)
Creatinine: 0.6 mg/dL (ref 0.60–1.30)
EGFR: >90
Glucose: 115 mg/dL (ref 70–100)
Osmolality, Calculated: 283 mOsm/kg (ref 278–305)
Potassium: 3.8 mmol/L (ref 3.5–5.3)
Sodium: 137 mmol/L (ref 133–146)

## 2021-10-24 LAB — URINE CULTURE

## 2021-10-24 MED ORDER — lactated Ringers infusion 1,000 mL
Freq: Once | INTRAVENOUS | Status: AC
Start: 2021-10-24 — End: 2021-10-24
  Administered 2021-10-24: 13:00:00 1000 mL via INTRAVENOUS

## 2021-10-24 MED ORDER — ondansetron (ZOFRAN) 4 MG tablet
4 | ORAL_TABLET | Freq: Three times a day (TID) | ORAL | 0 refills | PRN
Start: 2021-10-24 — End: 2021-11-17

## 2021-10-24 NOTE — Unmapped (Signed)
Non Stress Test - Resident Note  Date: 10/24/2021   Time: 9:50 AM    Tracing starts at 0817    Baby 1  FHR Baseline:  145  Variability:  moderate (6-25 beats per minute)  Acceleration: Present  Decelerations Episodic: No  Uterine Contractions: quiet following IVF bolus  Irritability: No    Comments: Ashley LordsRebecca Schwartz is a 31 y.o. G1P0 at 2453w2d   Interpretation: reactive NST    Delrae AlfredHanaa Lydie Stammen, DO  PGY-3 OB/GYN Resident

## 2021-10-24 NOTE — Unmapped (Signed)
Pt was sent in from office for IV fluids following vomiting and diarrhea. 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 Pt reports vomiting and diarrhea all day Saturday and was feeling better yesterday and then has had 5 episodes of diarrhea and 1 episode of vomiting today. Pt reports she has been able to keep Gatorade down but nothing else. Pt endorses +fm and denies ctx, vb, or lof. Pt on CEFM and MD updated.

## 2021-10-24 NOTE — Unmapped (Signed)
Ob Triage History and Physical    Patient Name: Ashley Schwartz  MRN: [id number]  Date of Birth: [birth year]  Date of Service: 10/24/2021     CC: dehydration    HPI: Ashley Schwartz is a 31 y.o. G1P0 at [redacted]w[redacted]d weeks gestation by early outside ultrasound who presents to [facility] Triage from [facility] for IV fluids, UA, and BMP in the s/o recent viral GI illness with emesis and diarrhea.    Nausea, vomiting, diarrhea starting 2 days ago  Works at daycare with known sick contacts  Symptoms improved then came back today  One episode of vomiting and five episodes of diarrhea this AM  No blood in stool or emesis  Currently tolerating fluids, has not tried eating yet  No abdominal pain  FHR in clinic 160s    She denies LOF, vaginal bleeding, contractions.   She reports normal fetal movement.   She denies headaches, fevers, chills, chest pain, shortness of breath, or dysuria      Primary OBGYN: [facility]    Antepartum Hx:  Late transfer of care, initially seen at [facility] in [place]  Transferred to [facility] 10/14/21  GERD in pregnancy, well-controlled with Pepcid  H/o anxiety, not on medications      Obstetrical History:  OB History   Gravida Para Term Preterm AB Living   1             SAB IAB Ectopic Multiple Live Births                  # Outcome Date GA Lbr Len/2nd Weight Sex Delivery Anes PTL Lv   1 Current              Denies h/o operative delivery, shoulder dystocia, and postpartum hemorrhage    Gynecologic History:  No abnormal pap smears  No STI    No past medical history on file.    No past surgical history on file.    No family history on file.    Social History:   Social History     Socioeconomic History    Marital status: Married     Spouse name: Not on file    Number of children: Not on file    Years of education: Not on file    Highest education level: Not on file   Occupational History    Not on file   Tobacco Use    Smoking status: Never    Smokeless tobacco: Never   Vaping Use    Vaping Use: Never used    Substance and Sexual Activity    Alcohol use: Never    Drug use: Never    Sexual activity: Yes     Partners: Male   Other Topics Concern    Caffeine Use No    Occupational Exposure No    Exercise No    Seat Belt Yes   Social History Narrative    Not on file     Social Determinants of Health     Financial Resource Strain: Not on file   Physical Activity: Not on file   Stress: Not on file   Social Connections: Not on file       Allergies:   No Known Allergies    Medications:   No current facility-administered medications on file prior to encounter.     Current Outpatient Medications on File Prior to Encounter   Medication Sig Dispense Refill    famotidine (PEPCID) 20 MG tablet  Take 1 tablet (20 mg total) by mouth 2 times a day. 60 tablet 2    ondansetron (ZOFRAN) 4 MG tablet Take 1 tablet (4 mg total) by mouth every 8 hours as needed for Nausea. 20 tablet 0       Physical Examination:   Patient Vitals for the past 4 hrs:   BP Temp Temp src Pulse Resp SpO2   10/24/21 0950 -- -- -- 84 -- 99 %   10/24/21 0819 136/77 98.2 F (36.8 C) Oral 115 16 97 %     General: well nourished, well developed, in no apparent distress   HEENT: normocephalic, atraumatic, dry oral mucosa  Pulmonary: normal quiet breathing with symmetric chest rise  Cardiac: Mildly tachycardic 100s, regular rhythm  Abdomen: Soft, non-tender, gravid uterus   Ext: no peripheral edema  Skin: warm and well perfused     Electronic Fetal Monitoring: see separate procedure note          Lab Review:  Pap: no hx abnormal pap, next pap postpartum   Blood type: A  (10/14/21)  Rh: positive (10/14/21)  Rubella: immune  (10/14/21)  HIV: non-reactive  (10/14/21)  VDRL/RPR: negative (10/14/21)  HBsAg: non-reactive  (10/14/21)  GC/Chlamydia: negative (10/14/21)  1 hr GCT: normal (10/14/21)  GBS: to be done next week    Ultrasound: early outside US, no details available      Assessment/Plan  30 y.o. G1P0 at 6950w2d weeks gestation by early outside ultrasound presents with  dehydration in s/o GI illness. Feels better and HR normalized after IVF. Tolerating fluids. Reactive NST.    Dehydration  - Recent GI illness, likely viral  - Tolerating PO  - IVF 1L bolus  - BMP: no AKI, lytes WNL  - UA without ketones, some bacteria w/ squams, will send for UCx  - Recommend continued PO hydration at discharge  - Has Zofran at home    Labor  - No evidence of labor    Fetal status  - Reactive NST    GBS   - Testing at next OB visit    PNC  - Rh-positive  - Rubella immune  - S/p TdaP (10/14/21)  - Feeding plans TBD  - BCM plans TBD      Dispo  - Discharge home  - Scheduled OB follow-up as below  - ED return precautions given    Future Appointments   Date Time Provider Department Center   10/26/2021  2:10 PM PUSND SONO RM 4 [facility]   10/31/2021  7:45 AM Grandville SilosMolly Suzanne Carey, MD [facility]   11/07/2021  7:45 AM Dione PloverMeredith Pensak, MD [facility]   11/14/2021  8:00 AM Kirt BoysMolly Roanna EpleySuzanne Carey, MD [facility]       Discussed with R4, Dr. Genevive Biiemersma and Attending Physician, Dr. Ernesta Amble'Connor    Grey Schlauch, MD  [facility]  Emergency Medicine PGY-2         Madolyn FriezeIsabel Sy Saintjean, MD  Resident  10/24/21 [phone]

## 2021-10-24 NOTE — Unmapped (Signed)
Ob Triage History and Physical    Patient Name: Ashley Schwartz  MRN: [id number]  Date of Birth: [birth year]  Date of Service: 10/24/2021     CC: dehydration    HPI: Ashley Schwartz is a 31 y.o. G1P0 at [redacted]w[redacted]d weeks gestation by early outside ultrasound who presents to [facility] Triage from [facility] for IV fluids, UA, and BMP in the s/o recent viral GI illness with emesis and diarrhea.    Nausea, vomiting, diarrhea starting 2 days ago  Works at daycare with known sick contacts  Symptoms improved then came back today  One episode of vomiting and five episodes of diarrhea this AM  No blood in stool or emesis  Currently tolerating fluids, has not tried eating yet  No abdominal pain  FHR in clinic 160s    She denies LOF, vaginal bleeding, contractions.   She reports normal fetal movement.   She denies headaches, fevers, chills, chest pain, shortness of breath, or dysuria      Primary OBGYN: [facility]    Antepartum Hx:  Late transfer of care, initially seen at [facility] in [place]  Transferred to [facility] 10/14/21  GERD in pregnancy, well-controlled with Pepcid  H/o anxiety, not on medications      Obstetrical History:                   OB History   Gravida Para Term Preterm AB Living   1             SAB IAB Ectopic Multiple Live Births                        # Outcome Date GA Lbr Len/2nd Weight Sex Delivery Anes PTL Lv   1 Current              Denies h/o operative delivery, shoulder dystocia, and postpartum hemorrhage    Gynecologic History:  No abnormal pap smears  No STI    No past medical history on file.    No past surgical history on file.    No family history on file.    Social History:   Social History           Socioeconomic History    Marital status: Married     Spouse name: Not on file    Number of children: Not on file    Years of education: Not on file    Highest education level: Not on file   Occupational History    Not on file   Tobacco Use    Smoking status: Never    Smokeless  tobacco: Never   Vaping Use    Vaping Use: Never used   Substance and Sexual Activity    Alcohol use: Never    Drug use: Never    Sexual activity: Yes     Partners: Male   Other Topics Concern    Caffeine Use No    Occupational Exposure No    Exercise No    Seat Belt Yes   Social History Narrative    Not on file     Social Determinants of Health     Financial Resource Strain: Not on file   Physical Activity: Not on file   Stress: Not on file   Social Connections: Not on file       Allergies:   No Known Allergies    Medications:   No current facility-administered medications on file  prior to encounter.     Current Outpatient Medications on File Prior to Encounter   Medication Sig Dispense Refill    famotidine (PEPCID) 20 MG tablet Take 1 tablet (20 mg total) by mouth 2 times a day. 60 tablet 2    ondansetron (ZOFRAN) 4 MG tablet Take 1 tablet (4 mg total) by mouth every 8 hours as needed for Nausea. 20 tablet 0       Physical Examination:   Patient Vitals for the past 4 hrs:   BP Temp Temp src Pulse Resp SpO2   10/24/21 0950 -- -- -- 84 -- 99 %   10/24/21 0819 136/77 98.2 F (36.8 C) Oral 115 16 97 %     General: well nourished, well developed, in no apparent distress   HEENT: normocephalic, atraumatic, dry oral mucosa  Pulmonary: normal quiet breathing with symmetric chest rise  Cardiac: Mildly tachycardic 100s, regular rhythm  Abdomen: Soft, non-tender, gravid uterus   Ext: no peripheral edema  Skin: warm and well perfused     Electronic Fetal Monitoring: see separate procedure note     Lab Review:  Pap: no hx abnormal pap, next pap postpartum   Blood type: A  (10/14/21)  Rh: positive (10/14/21)  Rubella: immune  (10/14/21)  HIV: non-reactive  (10/14/21)  VDRL/RPR: negative (10/14/21)  HBsAg: non-reactive  (10/14/21)  GC/Chlamydia: negative (10/14/21)  1 hr GCT: normal (10/14/21)  GBS: to be done next week    Ultrasound: early outside [place], no details available      Assessment/Plan  30 y.o.  G1P0 at [redacted]w[redacted]d weeks gestation by early outside ultrasound presents with dehydration in s/o GI illness. Feels better and HR normalized after IVF. Tolerating fluids. Reactive NST.    Dehydration  - Recent GI illness, likely viral  - Tolerating PO  - IVF 1L bolus  - BMP: no AKI, lytes WNL  - UA without ketones, some bacteria w/ squams, will send for UCx  - Recommend continued PO hydration at discharge  - Has Zofran at home    Labor  - No evidence of labor    Fetal status  - Reactive NST    GBS   - Testing at next OB visit    PNC  - Rh-positive  - Rubella immune  - S/p TdaP (10/14/21)  - Feeding plans TBD  - BCM plans TBD      Dispo  - Discharge home  - Scheduled OB follow-up as below  - ED return precautions given           Future Appointments   Date Time Provider Department Center   10/26/2021  2:10 PM PUSND SONO RM 4 [facility]   10/31/2021  7:45 AM Grandville Silos, MD [facility]   11/07/2021  7:45 AM Dione Plover, MD [facility]   11/14/2021  8:00 AM Kirt Boys Roanna Epley, MD [facility]       Discussed with R4, Dr. Genevive Bi and Attending Physician, Dr. Ernesta Amble, MD  [facility]  Emergency Medicine PGY-2         Madolyn Frieze, MD  Resident  10/24/21 1130       Madolyn Frieze, MD  Resident  10/28/21 [phone]

## 2021-10-24 NOTE — Unmapped (Signed)
ROB  [redacted]w[redacted]d    Good FM.  Denies SROM, bleeding, significant contractions.  Has anatomy ultrasound scheduled for later this week.    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  Tolerating fluids.  + emesis and diarrhea. No fevers. No blood in stool. No recent antibiotics or travel.       Problem List reviewed and updated.    O:  BP 117/73 (BP Location: Left arm, Patient Position: Sitting, BP Cuff Size: Large)    Pulse 128    Temp 97.9 ??F (36.6 ??C) (Oral)    Ht 5' 5 (1.651 m)    Wt 209 lb (94.8 kg)    LMP 02/12/2021 (Exact Date)    BMI 34.78 kg/m??   Gen: NAD  Pulm: Unlabored  Abd: gravid, soft, NT, +FH 160s      A/P: 58 G1P0 at 36+  -Diarrhea/Emesis: likely viral given + exposure at work.  Given maternal and fetal tachycardia, likely dehydrated.  Recommended IV fluids in OB ED.  Report called. Discussed can have UA and BMP in OB ED.  -GBS next visit  -Has anatomy ultrasound scheduled for this week    RTO weekly    Donell Sievert, MD

## 2021-10-24 NOTE — Unmapped (Addendum)
You were seen in OB Triage for dehydration. Your labs were reassuring. Your urine had some bacteria, likely from contamination, but we will send for culture and you'll be notified if there's any concern for a urinary tract infection.    Continue working on oral hydration  Advance your diet as tolerated  Take Zofran as needed    [facility]  You have regular contractions that are 6 mins apart or less  You have a gush of fluid  You have vaginal bleeding  You have a severe headache that does not go away or get better with medications  You have severe nausea or vomiting that does not go away or get better with medication

## 2021-10-26 ENCOUNTER — Ambulatory Visit: Admit: 2021-10-26 | Payer: PRIVATE HEALTH INSURANCE

## 2021-10-26 DIAGNOSIS — O99213 Obesity complicating pregnancy, third trimester: Secondary | ICD-10-CM

## 2021-10-26 NOTE — Unmapped (Signed)
Ultrasound billing only

## 2021-10-31 ENCOUNTER — Ambulatory Visit: Admit: 2021-10-31 | Payer: PRIVATE HEALTH INSURANCE

## 2021-10-31 DIAGNOSIS — O0933 Supervision of pregnancy with insufficient antenatal care, third trimester: Secondary | ICD-10-CM

## 2021-10-31 DIAGNOSIS — O0993 Supervision of high risk pregnancy, unspecified, third trimester: Secondary | ICD-10-CM

## 2021-10-31 LAB — STREP B DNA AMPLIFIED ASSAY: Strep B DNA Amp: NEGATIVE

## 2021-10-31 NOTE — Unmapped (Signed)
ROB  480w2d    Good FM.  Denies SROM, bleeding, significant contractions  Had viral GI illness last week.  Feeling better.  No further symptoms and tolerating diet.       Problem List reviewed and updated.    O:  BP 134/90 (BP Location: Right arm, Patient Position: Sitting, BP Cuff Size: Large)   Pulse 87   Temp 98.2 F (36.8 C) (Oral)   Ht 5' 5 (1.651 m)   Wt 216 lb (98 kg)   LMP 02/12/2021 (Exact Date)   BMI 35.94 kg/m   Gen: NAD  Pulm: Unlabored  Abd: gravid, soft, NT, +FH 130s  GU: normal external genitalia      A/P: 37+ doing well  -GBS today  -Discussed contraception- desires Nexplanon  -Reviewed BPP starting at 3539 weeks  -RTO weekly    Ashley SievertMolly Dessiree Sze, MD

## 2021-11-07 ENCOUNTER — Ambulatory Visit: Admit: 2021-11-07 | Payer: PRIVATE HEALTH INSURANCE

## 2021-11-07 DIAGNOSIS — O0993 Supervision of high risk pregnancy, unspecified, third trimester: Secondary | ICD-10-CM

## 2021-11-07 NOTE — Unmapped (Signed)
Return OB appointment    Ashley Schwartz  [id number]  [redacted]w[redacted]d    HPI: No concerns, feeling well. Good FM.     ROS: No bleeding, leaking, contractions    BP 127/78 (BP Location: Left arm, Patient Position: Sitting, BP Cuff Size: Regular)   Pulse 89   Temp 97.8 F (36.6 C) (Oral)   Resp 12   Wt 216 lb 6.4 oz (98.2 kg)   LMP 02/12/2021 (Exact Date)   BMI 36.01 kg/m   NAD  No respiratory distress  Abd - nt, gravid  Fetal heart tones and fundal height per flowsheet    Assessment and plan:  [redacted]w[redacted]d  - Problem list reviewed and updated  - Late to [facility], sub optimal dating  - GBS negative    RTC 1 weeks    Pt instructed to return to hospital for bleeding, leaking, painful contractions, or decreased fetal movement    Dione Plover, MD

## 2021-11-14 ENCOUNTER — Ambulatory Visit: Payer: PRIVATE HEALTH INSURANCE

## 2021-11-15 ENCOUNTER — Ambulatory Visit: Admit: 2021-11-15 | Payer: PRIVATE HEALTH INSURANCE | Attending: Maternal & Fetal Medicine

## 2021-11-15 ENCOUNTER — Ambulatory Visit: Admit: 2021-11-15 | Payer: PRIVATE HEALTH INSURANCE | Attending: Family

## 2021-11-15 DIAGNOSIS — O0993 Supervision of high risk pregnancy, unspecified, third trimester: Secondary | ICD-10-CM

## 2021-11-15 DIAGNOSIS — O134 Gestational [pregnancy-induced] hypertension without significant proteinuria, complicating childbirth: Secondary | ICD-10-CM

## 2021-11-15 MED ORDER — ondansetron (ZOFRAN) injection 4 mg
4 | Freq: Three times a day (TID) | INTRAMUSCULAR | PRN
Start: 2021-11-15 — End: 2021-11-16
  Administered 2021-11-16: 15:00:00 4 mg via INTRAVENOUS

## 2021-11-15 MED ORDER — lactated Ringers 1,000 mL bolus
INTRAVENOUS | PRN
Start: 2021-11-15 — End: 2021-11-16

## 2021-11-15 MED ORDER — lidocaine (PF) 2% (20 mg/mL) Soln 20 mg
20 | Freq: Once | INTRAMUSCULAR | Status: AC | PRN
Start: 2021-11-15 — End: 2021-11-15

## 2021-11-15 MED ORDER — dexamethasone (DECADRON) injection 4 mg
4 | Freq: Once | INTRAMUSCULAR | Status: AC | PRN
Start: 2021-11-15 — End: 2021-11-15

## 2021-11-15 MED ORDER — carboprost (HEMABATE) injection 250 mcg
250 | INTRAMUSCULAR | PRN
Start: 2021-11-15 — End: 2021-11-16

## 2021-11-15 MED ORDER — oxytocin (PITOCIN) 30 units in lactated ringers 500 mL infusion
30 | Freq: Once | INTRAVENOUS | PRN
Start: 2021-11-15 — End: 2021-11-16
  Administered 2021-11-16: 19:00:00 83.3 mL/h via INTRAVENOUS

## 2021-11-15 MED ORDER — oxytocin (PITOCIN) injection 10 Units
10 | Freq: Once | INTRAMUSCULAR | Status: AC | PRN
Start: 2021-11-15 — End: 2021-11-15

## 2021-11-15 MED ORDER — oxytocin (PITOCIN) 30 units in lactated ringers 500 mL IV BOLUS
30 | INTRAVENOUS | PRN
Start: 2021-11-15 — End: 2021-11-16

## 2021-11-15 MED ORDER — peppermint oiL liquid 1 mL
Freq: Once | Status: AC | PRN
Start: 2021-11-15 — End: 2021-11-15

## 2021-11-15 MED ORDER — miSOPROStoL (CYTOTEC) tablet 800 mcg
200 | Freq: Once | ORAL | Status: AC | PRN
Start: 2021-11-15 — End: 2021-11-15

## 2021-11-15 MED ORDER — lidocaine (LMX) 4 % cream
4 | Freq: Once | TOPICAL | Status: AC | PRN
Start: 2021-11-15 — End: 2021-11-15

## 2021-11-15 MED ORDER — oxytocin (PITOCIN) 30 units in lactated ringers 500 mL IV BOLUS
30 | Freq: Once | INTRAVENOUS | PRN
Start: 2021-11-15 — End: 2021-11-16

## 2021-11-15 MED ORDER — tranexamic acid (CYCLOKAPRON) IV Push - for Post-Partum Hemorrhage 1,000 mg
1000 | Freq: Once | INTRAVENOUS | Status: AC | PRN
Start: 2021-11-15 — End: 2021-11-15

## 2021-11-15 MED ORDER — proMETHazine (PHENERGAN) injection 6.25 mg
25 | Freq: Four times a day (QID) | INTRAMUSCULAR | PRN
Start: 2021-11-15 — End: 2021-11-16

## 2021-11-15 MED ORDER — lactated Ringers infusion
INTRAVENOUS | PRN
Start: 2021-11-15 — End: 2021-11-16

## 2021-11-15 MED ORDER — lactated Ringers infusion
INTRAVENOUS
Start: 2021-11-15 — End: 2021-11-16
  Administered 2021-11-16: 05:00:00 125 mL/h via INTRAVENOUS

## 2021-11-15 MED ORDER — methylergonovine (METHERGINE) injection 0.2 mg
0.2 | INTRAMUSCULAR | PRN
Start: 2021-11-15 — End: 2021-11-16

## 2021-11-15 NOTE — Unmapped (Signed)
Pt is [redacted] wks pregnant and having contractions that are about 2-3 min apart.

## 2021-11-15 NOTE — Unmapped (Signed)
Return OB Visit    Subjective:     Ashley Schwartz is a 31 y.o. year old White or Caucasian female. She is a G1P0 at [redacted]w[redacted]d weeks.  Present today for ROB, accompanied by her husband.  Patient denies LOF, vaginal bleeding, contractions, lightheadedness, fever, chills recent falls or trauma, h/a, visual changes or RUQ pain.  Admits + fetal movement.    Admits to irregular contractions, last night. No increased perineal pressure.     Reviewed the following:    OB History   Gravida Para Term Preterm AB Living   1 0 0 0 0 0   SAB IAB Ectopic Multiple Live Births   0 0 0 0 0      # Outcome Date GA Lbr Len/2nd Weight Sex Delivery Anes PTL Lv   1 Current              No Known Allergies       Patient Active Problem List   Diagnosis    High-risk pregnancy in third trimester    Late prenatal care       The following portions of the patient's history were updated as appropriate: allergies, current medications, past family history, past medical history, past social history, past surgical history and were updated and noted within the chart.    Objective:    Vitals:    11/15/21 0937   BP: 131/82   Pulse: 87   Temp: 98.4 F (36.9 C)     Current Outpatient Medications   Medication Sig    famotidine Take 1 tablet (20 mg total) by mouth 2 times a day.    ondansetron Take 1 tablet (4 mg total) by mouth every 8 hours as needed for Nausea.     No current facility-administered medications for this visit.     Lab Results   Component Value Date    WBC 15.4 (H) 10/14/2021    HGB 10.9 (L) 10/14/2021    HCT 32.3 (L) 10/14/2021    MCV 85.4 10/14/2021    PLT 271 10/14/2021     No results found for: POCGMD  No results found for: HGBA1C  No results found for: TSH, FREET4  Lab Results   Component Value Date    GLU1HR 91 10/14/2021     No results found for: LABDSR, TRISOMY18SCN, OSBRRSK, AFPMOM, MSHCGMOM, LABDIA, ESTMOM    Physical Exam  Vitals and nursing note reviewed.   Constitutional:       General: She is awake.      Appearance: Normal  appearance. She is well-developed and well-groomed. She is obese.   HENT:      Head: Normocephalic and atraumatic.   Pulmonary:      Effort: Pulmonary effort is normal.   Abdominal:      Palpations: Abdomen is soft.      Tenderness: There is no abdominal tenderness.   Musculoskeletal:         General: Normal range of motion.   Skin:     General: Skin is warm and dry.   Neurological:      General: No focal deficit present.      Mental Status: She is alert and oriented to person, place, and time.   Psychiatric:         Attention and Perception: Attention and perception normal.         Mood and Affect: Mood and affect normal.         Speech: Speech normal.  Behavior: Behavior normal. Behavior is cooperative.         Thought Content: Thought content normal.         Judgment: Judgment normal.       Fetal Heart Rate: 143  Fundal Height (cm): 39 cm  Movement: +    Assessment:    31 y.o. year old G1P0 at [redacted]w[redacted]d  IUP with  Estimated Date of Delivery: 11/19/21 .  Patient Active Problem List    Diagnosis     High-risk pregnancy in third trimester      Note Last Updated: 11/15/2021     Dating:36 wks  NOB Labs: WNL  Aneuploidy: decl  Flu:  COVID: pt reports s/p vaccine  Anatomy: anterior, wnl  Early GCT:  Timed GCT: 91  3rd Trimester STD labs:  TDAP: 10/14/21  GBS: negative  Contraception: Nexplanon  Feed: breast/bottle  Delivery plan: sub optimal dating; 59'6 IOL if no spontaneous labor      Late prenatal care      Note Last Updated: 10/14/2021     Presented at 34w       Plan:    Labor precautions  Pre eclampsia S&S discussed  Growth ultrasounds to follow this appt.  Reviewed  S/S of concern and when to call reviewed with patient.  AVS and contact information given.    RTC-1 week    Total time I spent for E/M services on the date of the encounter: 20 minutes     Salvadore Oxford, CNP  [facility] Obstetrics and Gynecology  Division of Maternal Fetal Medicine  [phone]  [phone]

## 2021-11-15 NOTE — Unmapped (Signed)
[facility] OB  Provider Note      Patient Name: Ashley Schwartz  MRN: [id number]  Date of Birth: [birth year]  Date of Service: 11/15/2021    Antepartum Care: [facility]    Subjective:  Ashley Schwartz is a 31 y.o. G1P0 at [redacted]w[redacted]d by 36w [place] who  presents with contractions q2-3 min for several hours and loss of mucus plug.     She denies LOF, vaginal bleeding. She reports normal fetal movement.    Denies headache, change in vision, RUQ pain.      Antepartum course complicated by:  Late PNC/suboptimal dating  Elevated BP       OB History   Gravida Para Term Preterm AB Living   1             SAB IAB Ectopic Multiple Live Births                  # Outcome Date GA Lbr Len/2nd Weight Sex Delivery Anes PTL Lv   1 Current                History:  Past Medical History: No past medical history on file.  Past Surgical History: No past surgical history on file.    Social History:   Social History     Socioeconomic History    Marital status: Significant Other     Spouse name: Not on file    Number of children: Not on file    Years of education: Not on file    Highest education level: Not on file   Occupational History    Not on file   Tobacco Use    Smoking status: Never    Smokeless tobacco: Never   Vaping Use    Vaping Use: Never used   Substance and Sexual Activity    Alcohol use: Never    Drug use: Never    Sexual activity: Yes     Partners: Male   Other Topics Concern    Caffeine Use No    Occupational Exposure No    Exercise No    Seat Belt Yes   Social History Narrative    Not on file     Social Determinants of Health     Financial Resource Strain: Not on file   Physical Activity: Not on file   Stress: Not on file   Social Connections: Not on file       Family History: No family history on file.    Medications:  No current facility-administered medications on file prior to encounter.     Current Outpatient Medications on File Prior to Encounter   Medication Sig Dispense Refill    famotidine (PEPCID) 20 MG tablet Take 1  tablet (20 mg total) by mouth 2 times a day. 60 tablet 2    ondansetron (ZOFRAN) 4 MG tablet Take 1 tablet (4 mg total) by mouth every 8 hours as needed for Nausea. 20 tablet 0       Allergies:  No Known Allergies    Objective:    Vital signs in last 24 hours:  Vitals:    11/15/21 2143   BP: 146/70   Pulse: 97   Resp: 18   SpO2: 98%   Weight: 213 lb (96.6 kg)       General: well nourished, well developed, in no apparent distress   HEENT: normocephalic, atraumatic, moist mucous membranes   Pulmonary: unlabored respirations  Cardiac: Regular rate  Abdomen: Soft, non-tender, gravid  uterus  GU: normal external genitalia, vagina and cervix without lesions   SVE: 1/40/-3  Skin: warm and well perfused, no edema  Neuro: alert and oriented x3  Psych: appropiate mood and affect     Electronic Fetal Monitoring:    Fetal Baseline: 150bpm   Variability: moderate (6-25 beats per minute)   Acceleration: Present   Deceleration: Absent   Uterine Activity: q2-5 min    Lab Review:  Blood type: A+  GBS: neg 10/31/21  HIV: neg 6/16  Trepia: neg 6/16  GC/CT: neg   Rubella: 6/16  GCT: 91  HepBsAg: neg 6/16  Hep C Ab: neg 6/16    Ultrasound:   10/26/21 ([redacted]w[redacted]d) EFW 3237g (61%), AC 82%, HC 56%, AFI 15.1cm, ceph, ant     Impression/Plan: Ashley Schwartz is a 32 y.o. G1P0 at [redacted]w[redacted]d by 36w [place] with contractions.    Contractions  - Reports contractions q2-3 min for several hours, lost mucus plug  - Clinically and hemodynamically stable, VSS, Rh +  - contracting q 2-5 on toco  - Given new dx of GHTN (below) at term, plan to admit for augmentation vs expectant  - Consented for C/S PRN    GHTN  - MR BP on arrival to [facility] ED with documented MR BP at ROB on 7/3  - Asx  - HELLP labs ordered  - Will admit for IOL    Fetal Status   - Category I  - EFW 3800g  - Cephalic by bedside US     GBS neg    - 10/31/21    Prenatal Care  - Rh +  - Rubella immune  - BCM: interval Paragard    Disposition:   - admit to L&D    Discussed with: Dr. Keitha Butte (R3) and Dr. Lula Olszewski  (Attending)    Jettie Pagan, MD   Obstetrics & Gynecology Resident        Jettie Pagan, MD  Resident  11/15/21 [phone]

## 2021-11-15 NOTE — Unmapped (Signed)
MRN: [id number]   Specialty: Ob [facility]     Patient Name: Ashley LordsRebecca Schwartz [phone]    Patient Date of Birth: 1991/02/22     Relationship of Caller to Patient and Callback: patient     Patient of: [facility]     Ashby Dawesature of Call: 39 weeks in 3 days- cramping every 2  mIns, 1  Hour ago states that she lost her mucus plug    On Call Provider Contacted: Dr. Janyth ContesEubanks- informed me to tell the patient to come in eto be seen    Time and Method of Contact: 8:29p    Advise Caller: If provider does not call back within 30 minutes, please call us back.    ROUTE TELEPHONE NOTE - Follow Qgenda and/or Route Directly to Provider.    COPY this note into AFTERHOURS Teams chat.

## 2021-11-15 NOTE — Unmapped (Signed)
Ultrasound billing only

## 2021-11-15 NOTE — Unmapped (Signed)
Pt presents to triage with c/o ctx - states she lost her mucous plug today - denies lof or vb - reports + FM - BP on admission 146/70 - BP cycled - Dr Caryn Bee at [facility] - SVE 1.5cm 40% -3

## 2021-11-16 ENCOUNTER — Inpatient Hospital Stay
Admission: EM | Admit: 2021-11-16 | Discharge: 2021-11-17 | Disposition: A | Discharge status: Home / Self Care | Payer: PRIVATE HEALTH INSURANCE

## 2021-11-16 LAB — URINALYSIS W/RFL TO MICROSCOPIC
Bilirubin, UA: NEGATIVE
Blood, UA: NEGATIVE
Glucose, UA: NEGATIVE mg/dL
Ketones, UA: 80 mg/dL
Leukocytes, UA: NEGATIVE
Nitrite, UA: NEGATIVE
Specific Gravity, UA: 1.03 (ref 1.005–1.035)
Urobilinogen, UA: 0.2 EU/dL (ref 0.2–1.0)
pH, UA: 6 (ref 5.0–8.0)

## 2021-11-16 LAB — OB URINE DRUG SCREEN REFLEX TO CONFIRMATION
Amphetamine, 500 ng/mL Cutoff: NEGATIVE
Barbiturates UR, 300  ng/mL Cutoff: NEGATIVE
Benzodiazepines UR, 300 ng/mL Cutoff: NEGATIVE
Buprenorphine, 5 ng/mL Cutoff: NEGATIVE
Cocaine UR, 300 ng/mL Cutoff: NEGATIVE
Fentanyl, 2 ng/mL Cutoff: NEGATIVE
Methadone, UR, 300 ng/mL Cutoff: NEGATIVE
Opiates UR, 300 ng/mL Cutoff: NEGATIVE
Oxycodone, 100 ng/mL Cutoff: NEGATIVE
THC UR, 50 ng/mL Cutoff: NEGATIVE
Tricyclic Antidepressants, 300 ng/mL Cutoff: NEGATIVE

## 2021-11-16 LAB — CBC
Hematocrit: 29.6 % (ref 35.0–45.0)
Hemoglobin: 9.9 g/dL (ref 11.7–15.5)
MCH: 28.4 pg (ref 27.0–33.0)
MCHC: 33.4 g/dL (ref 32.0–36.0)
MCV: 84.9 fL (ref 80.0–100.0)
MPV: 9.1 fL (ref 7.5–11.5)
Platelets: 249 10*3/uL (ref 140–400)
RBC: 3.49 10*6/uL (ref 3.80–5.10)
RDW: 14.7 % (ref 11.0–15.0)
WBC: 12.6 10*3/uL (ref 3.8–10.8)

## 2021-11-16 LAB — COMPREHENSIVE METABOLIC PANEL
ALT: 13 U/L (ref 7–52)
AST: 16 U/L (ref 13–39)
Albumin: 3.3 g/dL (ref 3.5–5.7)
Alkaline Phosphatase: 196 U/L (ref 36–125)
Anion Gap: 8 mmol/L (ref 3–16)
BUN: 10 mg/dL (ref 7–25)
CO2: 24 mmol/L (ref 21–33)
Calcium: 8.4 mg/dL (ref 8.6–10.3)
Chloride: 105 mmol/L (ref 98–110)
Creatinine: 0.55 mg/dL (ref 0.60–1.30)
EGFR: >90
Glucose: 86 mg/dL (ref 70–100)
Osmolality, Calculated: 282 mOsm/kg (ref 278–305)
Potassium: 3.8 mmol/L (ref 3.5–5.3)
Sodium: 137 mmol/L (ref 133–146)
Total Bilirubin: 0.5 mg/dL (ref 0.0–1.5)
Total Protein: 6.3 g/dL (ref 6.4–8.9)

## 2021-11-16 LAB — ANTIBODY SCREEN: Antibody Screen: NEGATIVE

## 2021-11-16 LAB — ABO/RH: Rh Type: POSITIVE

## 2021-11-16 LAB — LACTATE DEHYDROGENASE: LD: 137 U/L (ref 110–270)

## 2021-11-16 LAB — TREPONEMA PALLIDUM AB WITH REFLEX: Treponema Pallidum: NEGATIVE

## 2021-11-16 LAB — URIC ACID: Uric Acid: 4.1 mg/dL (ref 3.8–8.7)

## 2021-11-16 LAB — PERINATAL, HIV 1/2 AB+AG WITH REFLEX: HIV 1+2 AB/AGN: NONREACTIVE

## 2021-11-16 MED ORDER — hydrocortisone 1 % cream  (OB SELF ADMIN)
1 | Freq: Four times a day (QID) | TOPICAL | PRN
Start: 2021-11-16 — End: 2021-11-17

## 2021-11-16 MED ORDER — oxytocin (PITOCIN) 30 units in lactated ringers 500 mL IV BOLUS
30 | INTRAVENOUS | PRN
Start: 2021-11-16 — End: 2021-11-17

## 2021-11-16 MED ORDER — tranexamic acid (CYCLOKAPRON) IV Push - for Post-Partum Hemorrhage 1,000 mg
1000 | Freq: Once | INTRAVENOUS | Status: AC | PRN
Start: 2021-11-16 — End: 2021-11-16

## 2021-11-16 MED ORDER — lidocaine-EPINEPHrine 1.5 %-1:200,000 injection
1.5 | INTRAMUSCULAR | Status: AC
Start: 2021-11-16 — End: 2021-11-16
  Administered 2021-11-16: 09:00:00 3 via EPIDURAL

## 2021-11-16 MED ORDER — sodium chloride 0.9% flush 3 mL
INTRAMUSCULAR
Start: 2021-11-16 — End: 2021-11-17

## 2021-11-16 MED ORDER — terbutaline (BRETHINE) 1 mg/mL injection
1 | SUBCUTANEOUS | Status: AC
Start: 2021-11-16 — End: ?

## 2021-11-16 MED ORDER — methylergonovine (METHERGINE) injection 0.2 mg
0.2 | INTRAMUSCULAR | PRN
Start: 2021-11-16 — End: 2021-11-17

## 2021-11-16 MED ORDER — ibuprofen (MOTRIN) tablet (OB SELF-ADMIN) 600 mg
200 | Freq: Four times a day (QID) | ORAL | PRN
Start: 2021-11-16 — End: 2021-11-17

## 2021-11-16 MED ORDER — ROPivacaine (PF) (NAROPIN) 2 mg/mL (0.2 %) epidural
2 | INTRAMUSCULAR | PRN
Start: 2021-11-16 — End: 2021-11-16
  Administered 2021-11-16 (×2): 4

## 2021-11-16 MED ORDER — ondansetron (ZOFRAN) 4 mg/2 mL injection
4 | INTRAMUSCULAR | Status: AC
Start: 2021-11-16 — End: ?

## 2021-11-16 MED ORDER — docusate sodium (COLACE) capsule (OB SELF ADMIN) 100 mg
100 | Freq: Two times a day (BID) | ORAL | PRN
Start: 2021-11-16 — End: 2021-11-17

## 2021-11-16 MED ORDER — acetaminophen (TYLENOL) tablet (OB SELF ADMIN) 650 mg
325 | Freq: Four times a day (QID) | ORAL | PRN
Start: 2021-11-16 — End: 2021-11-17
  Administered 2021-11-16: 21:00:00 650 mg via ORAL

## 2021-11-16 MED ORDER — ROPivacaine (PF) (NAROPIN) 2 mg/mL (0.2 %) epidural
2 | INTRAMUSCULAR | Status: AC
Start: 2021-11-16 — End: ?

## 2021-11-16 MED ORDER — lactated Ringers 1,000 mL bolus
INTRAVENOUS | PRN
Start: 2021-11-16 — End: 2021-11-17

## 2021-11-16 MED ORDER — proMETHazine (PHENERGAN) injection 12.5 mg
25 | Freq: Once | INTRAMUSCULAR | Status: AC
Start: 2021-11-16 — End: 2021-11-16
  Administered 2021-11-16: 06:00:00 12.5 mg via INTRAVENOUS

## 2021-11-16 MED ORDER — carboprost (HEMABATE) injection 250 mcg
250 | INTRAMUSCULAR | PRN
Start: 2021-11-16 — End: 2021-11-17

## 2021-11-16 MED ORDER — oxytocin (PITOCIN) injection 10 Units
10 | Freq: Once | INTRAMUSCULAR | Status: AC | PRN
Start: 2021-11-16 — End: 2021-11-16

## 2021-11-16 MED ORDER — misoprostol (CYTOTEC) 25 MCG quarter tablet
25 | ORAL | Status: AC
Start: 2021-11-16 — End: ?

## 2021-11-16 MED ORDER — misoprostol (CYTOTEC) quarter tablet 25 mcg
25 | Freq: Four times a day (QID) | ORAL
Start: 2021-11-16 — End: 2021-11-16
  Administered 2021-11-16: 09:00:00 25 ug

## 2021-11-16 MED ORDER — benzocaine-menthoL (DERMOPLAST) 20-0.5 %
20-0.5 | Freq: Four times a day (QID) | TOPICAL | PRN
Start: 2021-11-16 — End: 2021-11-17

## 2021-11-16 MED ORDER — ROPivacaine (PF) (NAROPIN) 2 mg/mL (0.2 %) epidural
2 | INTRAMUSCULAR | PRN
Start: 2021-11-16 — End: 2021-11-16
  Administered 2021-11-16: 09:00:00 12

## 2021-11-16 MED ORDER — morphine 4 mg/mL injection
4 | INTRAVENOUS | Status: AC
Start: 2021-11-16 — End: ?

## 2021-11-16 MED ORDER — magnesium hydroxide (MILK OF MAGNESIA) 400 mg/5 mL oral suspension 30 mL
400 | Freq: Four times a day (QID) | ORAL | PRN
Start: 2021-11-16 — End: 2021-11-17

## 2021-11-16 MED ORDER — polyethylene glycol (GLYCOLAX) powder (OB SELF ADMIN) 17 g
17 | Freq: Two times a day (BID) | ORAL | PRN
Start: 2021-11-16 — End: 2021-11-17

## 2021-11-16 MED ORDER — prenatal vitamin tablet (OB SELF-ADMIN)
27 | Freq: Every day | ORAL
Start: 2021-11-16 — End: 2021-11-17

## 2021-11-16 MED ORDER — aluminum & magnesium hydroxide-simethicone (MYLANTA) suspension 30 mL
400-400-40 | Freq: After meals | ORAL | PRN
Start: 2021-11-16 — End: 2021-11-17

## 2021-11-16 MED ORDER — morphine injection 4 mg
4 | Freq: Once | INTRAVENOUS | Status: AC
Start: 2021-11-16 — End: 2021-11-16

## 2021-11-16 MED ORDER — miSOPROStoL (CYTOTEC) tablet 800 mcg
200 | Freq: Once | ORAL | Status: AC | PRN
Start: 2021-11-16 — End: 2021-11-16

## 2021-11-16 MED ORDER — oxytocin in lactated ringers 30 unit/500 mL IV infusion
30 | INTRAVENOUS | Status: AC
Start: 2021-11-16 — End: ?

## 2021-11-16 MED ORDER — morphine injection 4 mg
4 | Freq: Once | INTRAVENOUS | Status: AC
Start: 2021-11-16 — End: 2021-11-16
  Administered 2021-11-16: 06:00:00 4 mg via INTRAVENOUS

## 2021-11-16 MED ORDER — lactated Ringers infusion
INTRAVENOUS | PRN
Start: 2021-11-16 — End: 2021-11-17

## 2021-11-16 MED ORDER — proMETHazine (PHENERGAN) 25 mg/mL injection
25 | INTRAMUSCULAR | Status: AC
Start: 2021-11-16 — End: ?

## 2021-11-16 MED FILL — CLEARLAX 17 GRAM/DOSE ORAL POWDER: 17 17 gram/dose | ORAL | Qty: 119

## 2021-11-16 MED FILL — PROMETHAZINE 25 MG/ML INJECTION SOLUTION: 25 25 mg/mL | INTRAMUSCULAR | Qty: 1

## 2021-11-16 MED FILL — MORPHINE 4 MG/ML INTRAVENOUS SOLUTION: 4 4 mg/mL | INTRAVENOUS | Qty: 1

## 2021-11-16 MED FILL — PHILLIPS' LIQUI-GELS 100 MG CAPSULE: 100 100 mg | ORAL | Qty: 30

## 2021-11-16 MED FILL — OXYTOCIN IN LACTATED RINGERS 30 UNIT/500 ML INTRAVENOUS SOLUTION: 30 30 unit/500 mL | INTRAVENOUS | Qty: 1000

## 2021-11-16 MED FILL — ONDANSETRON HCL (PF) 4 MG/2 ML INJECTION SOLUTION: 4 4 mg/2 mL | INTRAMUSCULAR | Qty: 2

## 2021-11-16 MED FILL — MISOPROSTOL 25 MCG DOSE: 25 25 MCG | ORAL | Qty: 1

## 2021-11-16 MED FILL — NAROPIN (PF) 2 MG/ML (0.2 %) INJECTION SOLUTION: 2 2 mg/mL (0.2 %) | INTRAMUSCULAR | Qty: 200

## 2021-11-16 MED FILL — IBUPROFEN 200 MG TABLET: 200 200 MG | ORAL | Qty: 50

## 2021-11-16 MED FILL — ACETAMINOPHEN 325 MG TABLET: 325 325 MG | ORAL | Qty: 100

## 2021-11-16 MED FILL — TERBUTALINE 1 MG/ML SUBCUTANEOUS SOLUTION: 1 1 mg/mL | SUBCUTANEOUS | Qty: 1

## 2021-11-16 MED FILL — DERMOPLAST (WITH MENTHOL) 20 %-0.5 % TOPICAL AEROSOL: 20-0.5 20-0.5 % | TOPICAL | Qty: 85

## 2021-11-16 NOTE — Unmapped (Signed)
L&D Progress Note    Ashley Schwartz is a 31 y.o. G1P0 at 7124w4d by 9036 wk US who is currently admitted for IOL 2/2 GHTN at term.    S:   Patient reports she is doing well, has no complaints.    O:   Temp:  [98.1 F (36.7 C)-98.4 F (36.9 C)] 98.1 F (36.7 C)  Heart Rate:  [87-99] 99  Resp:  [16-18] 16  BP: (108-146)/(56-82) 108/56    No intake or output data in the 24 hours ending 11/16/21 0447    General: A&O, NAD   Cardiac: regular rate  Pulmonary: no respiratory distress  Abdomen: soft, non-tender, gravid uterus   Musculoskeletal: full range of motion, no swelling or erythema   Neuro: alert and oriented, normal gait  Psych: appropiate mood and affect     Fetal Baseline: 145 bpm  Variability: moderate  Acceleration: present  Deceleration: absent  Uterine Activity: q5-10 min    Assessment/Plan    Ashley LordsRebecca Schwartz is a 31 y.o. G1P0 at 2524w4d by 36 wk US who is currently admitted for IOL 2/2 GHTN at term.    IOL  - cervix 1.5/40/-3 on arrival  - s/p Foley balloon @ 0200  - 25 mcg misoprostol placed @ 0500    GHTN  - diagnosed based on mild range blood pressures at clinic [facility]  - HELLP labs wnl  - BP overnight normotensive to low mild range    [facility]  - will need to see ppSW    Fetal status  - category I  - EFW: 3800g by Leopold's  - presentation: cephalic by BSUS    GBS negative 10/31/21    Postpartum considerations  - Rh+   - Rubella immune  - BCM: interval IUD    Dispo  - L&D    [place]azil[place] Belen Pesch, MD  PGY-3  Ob/Gyn Resident

## 2021-11-16 NOTE — Unmapped (Signed)
Delivery Information for Ashley Schwartz    Labor Events:    Preterm labor: No   Labor onset date:     Labor onset time:     Antenatal steroids: None   Rupture date:     Rupture time:     Rupture type: Artificial   Fluid Color: Clear   Dilation complete date:     Dilation complete time:     Induction:     Induction indication:     Augmentation: AROM   Pushing Started:     Labor Complications None   Additional Complications:     Cervical ripening:            Antibiotics received during labor? No       Labor Length:    First Stage:   hours,   minutes    Second Stage:   hours,   minutes   Third Stage: 0 hours, 4 minutes       Delivery:    Episiotomy: None       Laceration Repaired?   Pitocin started within 60 seconds of delivery: Yes   Perineal: 1st Yes   Periurethral:       Labial:       Sulcus:       Vaginal:       Cervical:           Repair suture:     Repair # of packets:     Blood loss (ml):     Counts          Forceps attempted? No   Vacuum extractor attempted No   Indications:     Forceps application location:         Asst Del Details (If applicable): No data filed          Presentation/Position: Vertex; Right Occiput Anterior      Anesthesia:    Method:  Epidural   Analgesics:           Other Procedures: None    Birth information:  Date of birth: 11/16/2021   Time of birth: 1:33 PM   Sex: female   Delivery type: Vaginal, Spontaneous   Breech type (if applicable):     Observed anomalies/comments       Gestational Age: [redacted]w[redacted]d      Living? Living          APGARS  One minute Five minutes Ten minutes Fifteen minutes Twenty minutes   Skin color: 1   1             Heart rate: 2   2             Grimace: 2   2              Muscle tone: 2   2              Breathing: 2   2              Totals: 9   9                  Shoulder Dystocia:   Shoulder Dystocia    Shoulder dystocia present: No   Delivery of posterior arm:      Fetal clavicular fracture:      Gaskin maneuver:      McRoberts maneuver:      Rubin maneuver:       Suprapubic pressure:      Symphysiotomy:  Woods screw maneuver:      Zavanelli maneuver:                           Newborn Measurements:  Weight: 3461 g (7 lb 10.1 oz)    Length: 48.3 cm (19)    Head circumference (in): 13.976    Chest circumference (in):     Observed anomalies:     Date of Fetal Demise:   Weight: 122.1 oz Length: 19   Head circumference: 0.355 m           Cord Information: 3 vessels     Disposition of cord blood: Discarded    Blood gases sent? Yes  Complications: Nuchal   Cord clamped date/time:      Resuscitation: Suctioning     Placenta:  Delivered: 11/16/2021  1:37 PM  Appearance: Intact  Removal: Expressed    Disposition: morgue/discarded       Delivery Providers:    Delivery Clinician: Demaris Callanderocco Rossi    Other Providers: Abram SanderKNOUFF, Gerhard Rappaport;MCCOY, Ladene ArtistSAMANTHA;MARTIN, NICOLE;MALONE, MAGGI;DONOHUE, MADISON;MCWILLIAMS, [facility] LLCEVELYN Delivery Nurse;Delivery Nurse;Resident;Charge Nurse;Baby Nurse;Resident

## 2021-11-16 NOTE — Unmapped (Signed)
Problem: OB Pain  Goal: Patient's pain is progressing toward patient's stated pain goal  Description: Assess and monitor patient's pain using appropriate pain scale. Collaborate with interdisciplinary team and initiate plan and interventions as ordered. Re-assess patient's pain level 30 - 60 minutes after pain management intervention.   Note: .

## 2021-11-16 NOTE — Unmapped (Signed)
[place]  DEPARTMENT OF ANESTHESIOLOGY  PRE-PROCEDURAL EVALUATION    Ashley Schwartz is a 31 y.o. year old female presenting for:    * No procedures listed *    Surgeon:   * No surgeons listed *    Chief Complaint         Review of Systems     Anesthesia Evaluation    Patient summary reviewed.       No history of anesthetic complications   I have reviewed the History and Physical Exam, any relevant changes are noted in the anesthesia pre-operative evaluation.      Cardiovascular:    Exercise tolerance: good      Neuro/Muscoloskeletal/Psych:      (-) seizures.     Pulmonary:    (+) asthma.        GI/Hepatic/Renal:        Endo/Other:        (-) diabetes mellitus.       Past Medical History     No past medical history on file.    Past Surgical History     No past surgical history on file.    Family History     No family history on file.    Social History     Social History     Socioeconomic History    Marital status: Significant Other     Spouse name: Not on file    Number of children: Not on file    Years of education: Not on file    Highest education level: Not on file   Occupational History    Not on file   Tobacco Use    Smoking status: Never    Smokeless tobacco: Never   Vaping Use    Vaping Use: Never used   Substance and Sexual Activity    Alcohol use: Never    Drug use: Never    Sexual activity: Yes     Partners: Male   Other Topics Concern    Caffeine Use No    Occupational Exposure No    Exercise No    Seat Belt Yes   Social History Narrative    Not on file     Social Determinants of Health     Financial Resource Strain: Not on file   Physical Activity: Not on file   Stress: Not on file   Social Connections: Not on file       Medications     Allergies:  No Known Allergies    Home Meds:  Prior to Admission medications as of 11/15/21 0924   Medication Sig Taking?   famotidine (PEPCID) 20 MG tablet Take 1 tablet (20 mg total) by mouth 2 times a day.    ondansetron (ZOFRAN) 4 MG tablet Take 1 tablet  (4 mg total) by mouth every 8 hours as needed for Nausea.        Inpatient Meds:  Scheduled:    misoprostol  25 mcg Cervical 4 times per day     Continuous:    bolus IV fluid      lactated Ringers 125 mL/hr (11/16/21 0035)    lactated Ringers         PRN: carboprost, bolus IV fluid, lactated Ringers, methylergonovine, ondansetron, oxytocin (PITOCIN) 30 units in lactated ringers 500 mL, oxytocin (PITOCIN) 30 units in lactated ringers 500 mL, oxytocin (PITOCIN) 30 units in lactated ringers 500 mL, proMETHazine    Vital Signs     Wt Readings from  Last 3 Encounters:   11/15/21 213 lb (96.6 kg)   11/15/21 215 lb 9.6 oz (97.8 kg)   11/07/21 216 lb 6.4 oz (98.2 kg)     Ht Readings from Last 3 Encounters:   10/31/21 5' 5 (1.651 m)   10/24/21 5' 5 (1.651 m)     Temp Readings from Last 3 Encounters:   11/16/21 98.1 F (36.7 C) (Oral)   11/15/21 98.4 F (36.9 C) (Oral)   11/07/21 97.8 F (36.6 C) (Oral)     BP Readings from Last 3 Encounters:   11/16/21 125/58   11/15/21 131/82   11/07/21 127/78     Pulse Readings from Last 3 Encounters:   11/16/21 107   11/15/21 87   11/07/21 89     @LASTSAO2 (3)@    Physical Exam     Airway:     Mallampati: II  Mouth Opening: >2 FB  TM distance: > = 3 FB  Neck ROM: full    Dental:            Pulmonary:   - normal exam      Breath sounds clear to auscultation.       Cardiovascular:  - normal exam   Rhythm: regular  Rate: normal    Neuro/Musculoskeletal/Psych:  - normal neurological exam.   Mental status: alert and oriented to person, place and time.          Abdominal:    - normal exam    Current OB Status:    Gravida 1   Para 0     (+) PIH.  (-) gestational diabetes and prenatal care.     Other Findings:        Laboratory Data     Lab Results   Component Value Date    WBC 12.6 (H) 11/15/2021    HGB 9.9 (L) 11/15/2021    HCT 29.6 (L) 11/15/2021    MCV 84.9 11/15/2021    PLT 249 11/15/2021       No results found for: [facility]    Lab Results   Component Value Date    GLUCOSE 86 11/15/2021     BUN 10 11/15/2021    CO2 24 11/15/2021    CREATININE 0.55 (L) 11/15/2021    K 3.8 11/15/2021    NA 137 11/15/2021    CL 105 11/15/2021    CALCIUM 8.4 (L) 11/15/2021    ALBUMIN 3.3 (L) 11/15/2021    PROT 6.3 (L) 11/15/2021    ALKPHOS 196 (H) 11/15/2021    ALT 13 11/15/2021    AST 16 11/15/2021    BILITOT 0.5 11/15/2021       No results found for: PTT, INR    Lab Results   Component Value Date    PREGTESTUR Positive (A) 10/14/2021       Anesthesia Plan     ASA 2 - emergency         Female and current non-smoker         PONV Risk Factors: female, current non-smoker,  plan for postoperative opioid use.                      Plan, alternatives, and risks of anesthesia, including death, have been explained to and discussed with the patient/legal guardian.  By my assessment, the patient/legal guardian understands and agrees.  Scenario presented in detail.  Questions answered.    Use of blood products discussed with who consented to blood products.  Plan discussed with CRNA and attending.

## 2021-11-16 NOTE — Unmapped (Signed)
Ashley Schwartz is a 31 y.o. G1P0 woman at [redacted]w[redacted]d by 36wk [place] who called in with feeling cramping q2 mins. I was informed while I was scrubbed into the OR. She was told to present to triage for further evaluation through the operator.     Lawanna Kobus, M.D.  OBGYN PGY-4

## 2021-11-16 NOTE — Unmapped (Signed)
L&D Progress Note    Ashley Schwartz is an 31 y.o. G1P0 at [redacted]w[redacted]d admitted for IOL 2/2 GHTN.    To patient bedside for interval SVE.    Vitals:    11/16/21 1030 11/16/21 1100 11/16/21 1130 11/16/21 1200   BP: 116/56 107/60 117/69 116/63   Pulse: 107 119 84 88   Resp: 16      Temp:       TempSrc:       SpO2: 98% 96% 100% 97%   Weight:         SVE: 10/100/+2, ROA    Electronic Fetal Monitoring:               Fetal Baseline: 135bpm              Variability: moderate (6-25 beats per minute)              Acceleration: Present              Deceleration: Late              Uterine Activity: q2-3 minutes   Comments: Category II though overall reactive and reassuring    Patient to start pushing with RN    Durward Fortes, MD  Obstetrics and Gynecology PGY-4

## 2021-11-16 NOTE — Unmapped (Signed)
RN assumed care of patient. Patient resting comfortably in bed. Endorses +FM. PIV CDI. Meds administered per orders. Patient denies any further needs or questions at this time. CLWR, whiteboard updated.

## 2021-11-16 NOTE — Unmapped (Signed)
Pt admitted for IOL. Assessment completed as charted. VSS  POC discussed with pt. Questions answered. Pt oriented to room and call light. Pt denies any further needs.

## 2021-11-16 NOTE — Unmapped (Signed)
[facility]    Ashley LordsRebecca Schwartz  [id number]      Does the patient take care of her own medications at home? Yes    Is the patient at least 31 years old or older? Yes    Can the patient open bottles and read labels? Yes    Has the patient displayed any memory problems? No    Does the patient ever forget to take her medications? No    Does the patient speak and read [place] or Spanish? Yes    I have reviewed the above criteria and deem the patient as competent to self medicate unsupervised per Self Medication Policy [facility] [id number]  Appendix A   Yes      Ashley Schwartz

## 2021-11-16 NOTE — Unmapped (Signed)
Sw reviewed  chart and saw no Sw needs.  No Sw intervention needed for [facility].     Percell Miller MSW,LSW

## 2021-11-16 NOTE — Unmapped (Signed)
Problem: Vitals  Goal: Patient vital signs are stable  Outcome: Progressing     Problem: Fundus/Lochia  Goal: Fundus firm at midline  Outcome: Progressing     Problem: Breasts  Goal: Breasts are soft/filling, nipple intact, no complaints  Outcome: Progressing    Pt transferred via wheelchair from l and d into room 3426. Pts vitals are stable and charted. Admission education complete and signed. Pt oriented to room and pram. Call light within reach. Pt states that are all needs are met at this time.

## 2021-11-16 NOTE — Unmapped (Signed)
OB LABOR PROGRESS NOTE        Name: Ashley Schwartz  MRN: [id number]    S- comfortable with epidural    Vital signs:   Vitals:    11/16/21 0900   BP: 122/59   Pulse: 127   Resp:    Temp:    SpO2: 99%       Cervix:       Membranes: AROM- clear       Dilatation: 7cm       Effacement: 90%           Station: -1           ctxs q 4 mins, not tracing well with toco  FHTs 140 baseline with variable decels and accels, moderate variability  Late decel down to 90 for 2 mins  duration 6 mins       Assessment (pain control, progress in labor, current risk factors and potential concerns):   Plan:  1)pain mgmt: epidural  2)labor interventions:  AROM for clear fluid  3) Cat 2 tracing: CEFM      Santana Gosdin SWEENEY EDISON, CNM

## 2021-11-16 NOTE — Unmapped (Signed)
Assessment completed.  VSS.  No signs/symptoms of distress.  FOB at bedside. Fundus firm/midline.  Pt denies headache, visual changes, or RUQ pain.  Pt up to restroom to attempt to urinate, no output.  Pt states she can feel urge but it hurts. All needs met at this time.  Call light within reach.      Vitals:    11/16/21 1940   BP: 122/74   Pulse: 90   Resp: 16   Temp: 98.2 F (36.8 C)   SpO2: 100%

## 2021-11-16 NOTE — Unmapped (Signed)
Labor progress note    FHT: 150, moderate variability, accels present, no decels  Toco: q 4-6 minutes    Dilation: 2  Effacement (%): 60  Station: -3  Method: Manual  OB Examiner: Wealthy Danielski    IOL method: foley balloon. Miso x1 this encounter.   No pitocin  Intact membranes    Pt requesting epidural now.     Valeta Harms, MD PGY-1  Obstetrics and Gynecology Resident

## 2021-11-16 NOTE — Unmapped (Signed)
OB Epidural Placement      Reason for block: labor epidural  Diagnosis:  Labor Pain.  Requesting Physician/Surgeon: Algeo    Patient location during procedure: L&D room    Pre-procedure Pain Score:  10 - Worst pain ever                 Patient position: sitting      Preanesthetic Checklist  Completed: patient identified, anesthesia consent given, pre-op evaluation, timeout performed, IV checked, risks and benefits discussed, monitors and equipment checked/attached to patient, patient being monitored and hand hygiene performed  Anesthesia risks / alternatives discussed pre-op  Questions answered / anesthesia plan accepted and patient agrees to proceed..  Timeout performed at:  05:12 EDT.   Timeout verification:  correct patient, correct procedure, correct site and allergies reviewed.    Epidural    Prep: ChloraPrep and site prepped and draped                Approach: midline    Start time: 11/16/2021 5:13 AM    Needle     Injection technique: LOR saline    Needle type: Tuohy     Needle gauge: 17    Needle length: 3.5 in (9 cm)  Type:  lumbar epidural   Level of placement:  L3-4        Needle insertion depth: 6 cm    Catheter at skin depth: 10 cm  Number of attempts:  1        Test Dose Time:  11/16/2021 5:15 AM  Performed after negative aspiration.       Catheter secured and sterile dressing applied.      Assessment  Events:  blood not aspirated via needle, blood not aspirated via catheter, injection not painful, no injection resistance, no paresthesia with needle placement, no paresthesia with catheter placement, no other event, no positive intravascular test dose, no dural puncture/CSF with needle placement, no dural puncture/CSF with catheter placement and no positive intrathecal test dose                            Block Effect:  No Unexpected/Untoward Events and Successful                               Staffing  Performed: CRNA   Anesthesiologist: Wayna Chalet, MD  Resident/CRNA: Carita Pian,  CRNA      Medications Administered  lidocaine 1.5%-EPINEPHrine 1:200,000 injection - Epidural   3 mL - 11/16/2021 5:15:00 AMMedication administered at:  11/16/2021 5:15 AM

## 2021-11-16 NOTE — Unmapped (Signed)
L&D Upper Level Resident   Admission Note     Ashley Schwartz is a 31 y.o. G1P0 at 2627w4d by 36 wk US admitted for IOL 2/2 GTHN diagnosis at term.    No past medical history on file.  No past surgical history on file.  OB History   Gravida Para Term Preterm AB Living   1             SAB IAB Ectopic Multiple Live Births                  # Outcome Date GA Lbr Len/2nd Weight Sex Delivery Anes PTL Lv   1 Current                No current facility-administered medications on file prior to encounter.     Current Outpatient Medications on File Prior to Encounter   Medication Sig Dispense Refill    famotidine (PEPCID) 20 MG tablet Take 1 tablet (20 mg total) by mouth 2 times a day. 60 tablet 2    ondansetron (ZOFRAN) 4 MG tablet Take 1 tablet (4 mg total) by mouth every 8 hours as needed for Nausea. 20 tablet 0     No Known Allergies    Objective:   Patient Vitals for the past 24 hrs:   BP Temp Temp src Pulse Resp SpO2 Weight   11/16/21 0027 127/79 98.1 F (36.7 C) Oral 99 16 99 % --   11/15/21 2143 146/70 -- -- 97 18 98 % 213 lb (96.6 kg)       Please see Physical Exam by the [facility] Provider    FHT:              Fetal Baseline: 145 bpm              Variability: moderate              Acceleration: present              Deceleration: absent              Uterine Activity: q4 min   Category I    Recent Results (from the past 24 hour(s))   CBC    Collection Time: 11/15/21 10:18 PM   Result Value Ref Range    WBC 12.6 (H) 3.8 - 10.8 10E3/uL    RBC 3.49 (L) 3.80 - 5.10 10E6/uL    Hemoglobin 9.9 (L) 11.7 - 15.5 g/dL    Hematocrit 28.429.6 (L) 35.0 - 45.0 %    MCV 84.9 80.0 - 100.0 fL    MCH 28.4 27.0 - 33.0 pg    MCHC 33.4 32.0 - 36.0 g/dL    RDW 13.214.7 44.011.0 - 10.215.0 %    Platelets 249 140 - 400 10E3/uL    MPV 9.1 7.5 - 11.5 fL   Syphilis Screening [name](Trepia)    Collection Time: 11/15/21 10:18 PM   Result Value Ref Range    Treponema Pallidum Negative Negative   Perinatal, HIV 1/2 Ab+Ag with Reflex    Collection Time: 11/15/21 10:18 PM    Result Value Ref Range    HIV 1+2 AB/AGN Nonreactive Nonreactive   ABO/Rh    Collection Time: 11/15/21 10:18 PM   Result Value Ref Range    ABO Grouping A     Rh Type Positive    Antibody Screen    Collection Time: 11/15/21 10:18 PM   Result Value Ref Range    Antibody Screen Negative  Comprehensive metabolic panel    Collection Time: 11/15/21 10:18 PM   Result Value Ref Range    Sodium 137 133 - 146 mmol/L    Potassium 3.8 3.5 - 5.3 mmol/L    Chloride 105 98 - 110 mmol/L    CO2 24 21 - 33 mmol/L    Anion Gap 8 3 - 16 mmol/L    BUN 10 7 - 25 mg/dL    Creatinine 0.86 (L) 0.60 - 1.30 mg/dL    Glucose 86 70 - 578 mg/dL    Calcium 8.4 (L) 8.6 - 10.3 mg/dL    Total Bilirubin 0.5 0.0 - 1.5 mg/dL    AST 16 13 - 39 U/L    ALT 13 7 - 52 U/L    Alkaline Phosphatase 196 (H) 36 - 125 U/L    Total Protein 6.3 (L) 6.4 - 8.9 g/dL    Albumin 3.3 (L) 3.5 - 5.7 g/dL    Osmolality, Calculated 282 278 - 305 mOsm/kg    EGFR >90    Uric acid    Collection Time: 11/15/21 10:18 PM   Result Value Ref Range    Uric Acid 4.1 3.8 - 8.7 mg/dL   Lactate dehydrogenase    Collection Time: 11/15/21 10:18 PM   Result Value Ref Range    LD 137 110 - 270 U/L   OB Urine Drug Screen Reflex to Confirmation    Collection Time: 11/15/21 10:24 PM   Result Value Ref Range    Amphetamine, 500 ng/mL Cutoff Negative Negative    Barbiturates UR, 300  ng/mL Cutoff Negative Negative    Buprenorphine, 5 ng/mL Cutoff Negative Negative    Benzodiazepines UR, 300 ng/mL Cutoff Negative Negative    Cocaine UR, 300 ng/mL Cutoff Negative Negative    Methadone, UR, 300 ng/mL Cutoff Negative Negative    Opiates UR, 300 ng/mL Cutoff Negative Negative    Oxycodone, 100 ng/mL Cutoff Negative Negative    Tricyclic Antidepressants, 300 ng/mL Cutoff Negative Negative    THC UR, 50 ng/mL Cutoff Negative Negative    Fentanyl, 2 ng/mL Cutoff Negative Negative   Urinalysis w/Rfl to Microscop, No Cultur    Collection Time: 11/15/21 10:24 PM   Result Value Ref Range    Color, UA  Yellow Yellow,Straw    Clarity, UA Clear Clear    Specific Gravity, UA 1.030 1.005 - 1.035    pH, UA 6.0 5.0 - 8.0    Protein, UA Trace (A) Negative mg/dL    Glucose, UA Negative Negative mg/dL    Ketones, UA 80 (A) Negative mg/dL    Bilirubin, UA Negative Negative    Blood, UA Negative Negative    Nitrite, UA Negative Negative    Urobilinogen, UA 0.2 0.2 - 1.0 EU/dL    Leukocytes, UA Negative Negative       A/P: Ashley Schwartz is a 31 y.o. G1P0 at [redacted]w[redacted]d by 36 wk [place] admitted for IOL 2/2 to [facility] at term.    IOL  - cervix 1.5/40/-3 on arrival  - s/p Foley balloon @ 0200    GHTN  - diagnosed based on mild range blood pressures at clinic and on arrival to [facility] ED  - HELLP labs wnl    Late [facility]  - will need to see ppSW    Fetal status  - category I  - EFW: 3800g by Leopold's  - presentation: cephalic by BSUS    GBS negative 10/31/21    Postpartum considerations  - Rh+   -  Rubella immune  - BCM: interval IUD    Disposition: L&D    [place] Conroy Goracke, MD  PGY-3  Ob/Gyn Resident

## 2021-11-16 NOTE — Unmapped (Signed)
OB LABOR PROGRESS NOTE        Name: Charlott Calvario  MRN: [id number]    S- comfortable with epidural     Vital signs:   Vitals:    11/16/21 1100   BP: 107/60   Pulse: 119   Resp:    Temp:    SpO2: 96%       Cervix:       Membranes: AROM       Dilatation: 9.5cm       Effacement: 100%           Station: 0         ctxs q 1 1/2 - 2 1/2 mins  FHTs 140 baseline with accels, variable decels   decel to 90, x 1 min    Assessment (pain control, progress in labor, current risk factors and potential concerns):   Plan:  1)pain mgmt: epidural  2)labor interventions:  expectant management   3) Cat 2 tracing: CEFM    Gorge Almanza SWEENEY EDISON, CNM

## 2021-11-16 NOTE — Unmapped (Signed)
Problem: Safety  Goal: Patient will remain free of physical injury  Outcome: Progressing  Goal: Free from abuse  Outcome: Progressing  Goal: Maintain safety measures for infants  Outcome: Progressing     Problem: Daily Care  Goal: Daily care needs are met  Description: Assess and monitor ability to perform self care and identify potential discharge needs.  Outcome: Progressing     Problem: Vitals  Goal: Patient vital signs are stable  Outcome: Progressing

## 2021-11-16 NOTE — Unmapped (Signed)
Pt presented to OBED for evalutation.

## 2021-11-16 NOTE — Unmapped (Signed)
Labor progress note    FHT: 150, moderate variability, accels present, no decels  Toco: q3-284minutes    Dilation: 2  Effacement (%): 60  Station: -3  Consistency: medium  Position: midposition  Bishop Score: 5  Method: Manual  OB Examiner: Dr Denny PeonAvery    IOL method Foley ballon placed at 0200  Not on pit    Rx'd Morphine and Phenergan for ctx pain    Valeta HarmsHACE Carolanne Mercier, MD PGY-1  Obstetrics and Gynecology Resident

## 2021-11-16 NOTE — Unmapped (Signed)
Anesthesia Post Note    Patient: Ashley Schwartz    Procedure(s) Performed: SVD    Anesthesia type: No value filed.    Patient location: Labor and Delivery    Airway: Patent    Post pain: Adequate analgesia    Nausea / Vomiting: Absent    Post-operative Hydration Status: Adequate    Post assessment: no apparent anesthetic complications, tolerated procedure well and able to flex hips and bend knees bilateral    Last Vitals:   Vitals:    11/16/21 1355 11/16/21 1410 11/16/21 1425 11/16/21 1440   BP: 117/57 115/57 132/67 128/64   Pulse: 106 86 88 91   Resp:  16     Temp:  98.7 F (37.1 C)     TempSrc:  Oral     SpO2: 98% 99% 98% 98%   Weight:            Post vital signs: stable    Level of consciousness: awake, alert  and oriented    Complications:  No notable events documented.

## 2021-11-16 NOTE — Unmapped (Signed)
[facility]  VAGINAL DELIVERY NOTE    Name: Ashley Schwartz   DOB: 08-11-1990  MRN: [id number]     Preprocedure Diagnoses:  1. Intrauterine pregnancy at [redacted]w[redacted]d   2. gHTN    Postprocedure Diagnoses:  1. Intrauterine pregnancy at [redacted]w[redacted]d s/p NSVD  2. gHTN  3. Bilateral periclitoral lacerations s/p repair  4. 1st degree perineal laceration s/p repair    Anesthesia: epidural  Estimated blood loss: 184 mL  Complications: Bilateral periclitoral lacerations s/p repair, 1st degree perineal laceration s/p repair      Findings:   1. Female infant, Weight 3461g, apgars 9/9  2. 1st degree laceration s/p repair, bilateral periclitoral lacerations s/p repair  3. Intact placenta with 3 vessel cord    Procedure details:  This is a 31 y.o. G1P1001 at [redacted]w[redacted]d by 36 week ultrasound who was admitted for induction of labor secondary to gHTN. After an uncomplicated induction with foley balloon and miso x1 the patient was found to be complete.    Patient pushed with good maternal effort from 10/100/+2 with epidural anesthesia to deliver viable female infant over intact perineum. Infant head delivered OA position. Nuchal cord x2 reduced. anterior shoulder delivered easily with gentle downward traction, followed by posterior shoulder and body.     Infant placed on maternal abdomen. Cord clamping delayed x1 minute given infant vigorous with good cry. Cord then doubly clamped and cut. Cord blood and cord gases sent. Intact placenta with 3VC delivered spontaneously with gentle traction. Pitocin given per protocol. Fundus firm.    Cervix, perineum, and vagina explored. Bilateral periclitoral lacerations and 1st degree midline perineal laceration found and repaired with 3-0 vicryl in the normal fashion. Fundus still firm at end of procedure. Counts correct at the end of the procedure. Mom stable. Infant transitioning with mom in room.     R3 Dr. Al Pimple and attending Demaris Callander were present throughout the delivery.    Melina Modena, PGY-1  Obstetrics & Gynecology

## 2021-11-16 NOTE — Unmapped (Signed)
RN to bedside due to late decel. Pt repositioned from left lateral to high fowlers and then right lateral. Prolonged decel lasted from [phone]. Maggi, charge RN and Cherly Anderson, CNM to bedside. This RN tugged foley balloon and easily removed. SVE by Dot Lanes: 7/90/-2. Primary RNs, Gabby and Sam to bedside.

## 2021-11-17 LAB — CBC
Hematocrit: 25.9 % (ref 35.0–45.0)
Hemoglobin: 8.5 g/dL (ref 11.7–15.5)
MCH: 28 pg (ref 27.0–33.0)
MCHC: 32.8 g/dL (ref 32.0–36.0)
MCV: 85.4 fL (ref 80.0–100.0)
MPV: 9.1 fL (ref 7.5–11.5)
Platelets: 234 10*3/uL (ref 140–400)
RBC: 3.03 10*6/uL (ref 3.80–5.10)
RDW: 15.2 % (ref 11.0–15.0)
WBC: 15.6 10*3/uL (ref 3.8–10.8)

## 2021-11-17 MED ORDER — acetaminophen (TYLENOL) 325 MG tablet
325 | ORAL_TABLET | Freq: Four times a day (QID) | ORAL | 1 refills | 11.00000 days | Status: AC | PRN
Start: 2021-11-17 — End: ?

## 2021-11-17 MED ORDER — ibuprofen (MOTRIN) 600 MG tablet
600 | ORAL_TABLET | Freq: Four times a day (QID) | ORAL | 1 refills | Status: AC | PRN
Start: 2021-11-17 — End: ?

## 2021-11-17 NOTE — Unmapped (Signed)
This note was copied from a baby's chart.    LACTATION CONSULTATION    Initial Consult    Name: Ashley Schwartz     MRN: [id number]         Date of birth: 11/16/2021  Time of Birth: 1:33 PM  Gestational age: Gestational Age: 1451w4d Birth Weight: 7 lb 10.1 oz (3461 g) Most Recent Weight: Weight: 3393 g (7 lb 7.7 oz)  Weight Change from Birth: -2%       Maternal Assessment:     Maternal Data:  Information for the patient's mother:  Dara LordsDavis, Tena [[id number]][[id number]]   31 y.o.   Gravida/Para:   Information for the patient's mother:  Dara LordsDavis, Aleyssa [[id number]][[id number]]   G1P1001    Information for the patient's mother:  Dara LordsDavis, Raymond [[id number]][[id number]]   6951w4d     Prenatal Breastfeeding Education: None per MOB  Prior Breastfeeding Experience:1st time breastfeeding with this baby  Breastfeeding Goal: directly breastfeed    Breast Assessment:  Not assessed during this visit.    Medications of Concern:   None    Maternal Toxicology:   Information for the patient's mother:  Dara LordsDavis, Aubree [[id number]][[id number]]     Lab Results   Component Value Date    AMPHET500CUT Negative 11/15/2021    BARBSCRNUR Negative 11/15/2021    BUPRENOR5 Negative 11/15/2021    BENZOSCRNUR Negative 11/15/2021    THCSCRNUR Negative 11/15/2021    COCAINSCRNUR Negative 11/15/2021    OPIATESCRNUR Negative 11/15/2021    METHADSCRNUR Negative 11/15/2021    OXYCOD100CUT Negative 11/15/2021    TCA300CUT Negative 11/15/2021    FENT2NGMLCUT Negative 11/15/2021      Information for the patient's mother:  Dara LordsDavis, Jeroline [[id number]][[id number]]   No past medical history on file.   Information for the patient's mother:  Dara LordsDavis, Elanda [[id number]][[id number]]   Active Problems:    * No active hospital problems. *     Other significant maternal history:   ?urinary retention, [facility]N, [facility]     Delivery Information:  Born on 11/16/2021 at 1:33 PM    Delivery method: Vaginal, Spontaneous [250]    Additional Information:  Forceps attempted? No [0]  Vacuum extractor attempted? No [0]    Infant Assessment:    DOL:12 hours    Feeding:  Feeding Type: Mother's milk    Use of supplementation: N/a    Nipple Shield in use: No     I&O adequacy:  Urine output:  is established   Stool output:is established  Percent weight change from birthweight: -2%     Oral Assessment:  Tongue Tie Assessment Score: 7    Not assessed during this visit.    Birth factors or dx that could create risk for breastfeeding:  N/a    Glucose: No     Intervention during consultation:   Interventions performed: education    Latch & Positioning: LC to the room for the initial visit.  Baby was fussing on his back in the crib.  LC checked baby's diaper- it was clean/dry.  LC swaddled baby and returned him to his back in the crib and he quickly fell asleep.  LC provided BF education.  Encouraged an observation of a BF.  MOB reported baby was Bfing well so far, but preferring one side over the other- discussed 1-sided preferences and strategies to resolve.  No other questions/concerns/needs at this time.  Encouraged an observation of a BF.  [facility] in the room.    Manual Expression: not addressed  at this time    Bedside Breast Pump: N/A    Breast Shield Size: N/A    Amount of milk expressed: N/A    Pump Arrangements: Emailed to: [employer]  11/17/21    Education: latch and positioning, feeding frequency and feeding cues, exclusive breastfeeding, expected output or adequate intake, feeding and diaper log, STS, [facility] LC services, when to call LC, and f/u with ped    Handouts Provided: Yes   Hand expression, Engorgement, and Plugged Ducts and Mastitis    Resource information given/ discussed with patient:  Post discharge breastfeeding resources provided- Caring for yourself and your newborn, Breastfeeding crib card  Resources for post discharge follow up with outpatient lactation and WIC (if appropriate)  [facility] lactation services for breastfeeding help after discharge  See Discharge Instructions for more community lactation support and information.     Plan:     1. Breastfeed  on cue and at least 8 times/24hrs, unlimited timing. May not nurse this often in the first 24 hours. Wake baby and offer breastfeeding if 3 hours since beginning of last feeding. Place infant skin to skin if infant will not breastfeed at 3 hours.   2. Hand express prior to latch to evert nipple and entice infant to breast.   3. It is important to use gentle stimulation during the feeding to promote active eating. Offer both breasts at every feeding. Burp infant in between sides. Alternate which breast is used first.   4. Offer STS often while awake. Mother holding infant skin to skin between feedings will promote milk supply and allow infant to rest more deeply.   5. Maintain a feeding log until infant is gaining weight and seen by primary care physician.  6. Request BF assistance from [facility] or RN as needed.    Feeding plan reviewed with: MOB and FOB    Mother's response: verbalized understanding of education and instruction    [employer]  Lactation Services  [phone]

## 2021-11-17 NOTE — Unmapped (Signed)
OB/GYN Vaginal Delivery Postpartum Daily Progress Note    Subjective: Ashley Schwartz is a 31 y.o. G1P1001 PPD#1 following an uncomplicated SVD.    Today, the patient is feeling well with no complaints. She is ambulating and tolerating PO. Pain is well controlled on the current regimen. Lochia about the same as a period.    Denies headaches, changes in vision, RUQ pain, SOB, chest pain, nausea, vomiting, dizziness, and light-headedness.     Objective:   Vitals:    11/16/21 1713 11/16/21 1940 11/17/21 0019 11/17/21 0347   BP: 117/69 122/74 126/82 120/78   BP Location:  Right arm Right arm Right arm   Patient Position:  Lying Lying Lying   Pulse: 97 90  79   Resp: 15 16 16 17    Temp: 97.9 F (36.6 C) 98.2 F (36.8 C) 97.5 F (36.4 C) 97.3 F (36.3 C)   TempSrc: Oral Oral Oral Oral   SpO2: 100% 100% 100% 97%   Weight:             Intake/Output Summary (Last 24 hours) at 11/17/2021 0981  Last data filed at 11/17/2021 1914  Gross per 24 hour   Intake --   Output 2084 ml   Net -2084 ml       Gen: A&Ox3, no acute distress  CV: Regular rate  Resp: Non-labored breaths   Abd: Soft, non-distended, appropriately tender, fundus firm below umbilicus   Ext: Well perfused, no lower extremity swelling, SCDs in place    Lab Results   Component Value Date    WBC 12.6 (H) 11/15/2021    HGB 9.9 (L) 11/15/2021    HCT 29.6 (L) 11/15/2021    MCV 84.9 11/15/2021    PLT 249 11/15/2021       Lab Results   Component Value Date    RH Positive 11/15/2021       Assessment and Plan: Ashley Schwartz is a 31 y.o. G1P1001 PPD#1 following an uncomplicated SVD.    PPD#1  - Meeting postpartum milestones.   - Continue current care.  - Encourage ambulation  - voiding independently s/p foley  - Encourage fluids  - Rh +  - Immunizations: Rubella immune  - Antepartum Hgb: 9.9, Postpartum Hgb: 8.5    female infant  - Feeding via breast  - Circumcision desired    GHTN  - MR BP on arrival to [facility] ED with documented MR BP at ROB on 7/3  - Asx  - BP wnl postpartum  -  Admission HELLP labs wnl 7/17    Birth control method   - int paragaurd    VTE ppx  - SCDs    Disposition  - discharge candidate    Melina Modena, PGY-1  Obstetrics & Gynecology

## 2021-11-17 NOTE — Unmapped (Signed)
Assumed care. Oriented pt to staff and care plan. Assessment complete and WDL. Vitals stable. Pain denied. See MAR/flowsheets. No s/s of PPH. Pt ambulatory. Bed low/locked. Pt given discharge instructions verbalizes understanding and denies any questions or concerns at this time.       Problem: OB Pain  Goal: Patient's pain is progressing toward patient's stated pain goal  Description: Assess and monitor patient's pain using appropriate pain scale. Collaborate with interdisciplinary team and initiate plan and interventions as ordered. Re-assess patient's pain level 30 - 60 minutes after pain management intervention.   Outcome: Completed     Problem: Safety  Goal: Patient will remain free of physical injury  Outcome: Completed  Goal: Free from abuse  Outcome: Completed  Goal: Maintain safety measures for infants  Outcome: Completed     Problem: Daily Care  Goal: Daily care needs are met  Description: Assess and monitor ability to perform self care and identify potential discharge needs.  Outcome: Completed     Problem: Vitals  Goal: Patient vital signs are stable  Outcome: Completed     Problem: Dressing  Goal: Dressing intact until removed with any drainage marked  Outcome: Completed     Problem: Fundus/Lochia  Goal: Fundus firm at midline  Outcome: Completed     Problem: Elimination  Goal: Elimination patterns are normal or improving  Outcome: Completed     Problem: Breasts  Goal: Breasts are soft/filling, nipple intact, no complaints  Outcome: Completed     Problem: OB Nutrition  Goal: Maintain adequate nutrtion/hydration  Outcome: Completed     Problem: Adolescent patient  Goal: DEMONSTRATES ADAPTION TO CHANGE  Outcome: Completed     Problem: Psychosocial Needs  Goal: Demonstrates ability to cope with hospitalization/illness  Description: Assess and monitor patients ability to cope with his/her illness.  Outcome: Completed  Goal: Collaborate with patient/family to identify patient's goals  Outcome: Completed      Problem: Discharge Barriers  Goal: Patient's discharge needs are met  Description: Collaborate with interdisciplinary team and initiate plans and interventions as needed.   Outcome: Completed

## 2021-11-17 NOTE — Unmapped (Signed)
Ashley Schwartz   Psychosocial Assessment     Ashley Schwartz  [id number]  30 y.o.    No admission diagnoses are documented for this encounter.    Referred by: OB MD    Background: (e.g., name, gender, age, marital status, race/ethnicity, socioeconomic status, reason for referral)   Ashley Schwartz is a 30 y.o, married caucasian female. She was referred to SW due to late prenatal care.        Medical History: (Brief description regarding reason for admission)  Ashley Schwartz presented to UC for the birth of her first child. She delivered on 11/16/21 at [redacted]w[redacted]d.     Mental Health History: (including current level of functioning, coping style, substance use history, and any risk of suicide or homicide as applicable)  Ashley Schwartz denies that she has been diagnosed with any mental health concerns. Her chart does not show any past or current concerns for her mental health. She denied SI/HI or any feelings of depression. Ashley Schwartz presented to be smiling, upbeat and active.     Ashley Schwartz denied any substance use. She was not positive for any substances upon admission.      MSE:   Appearance: Clean and freshly showered.     Behavior: Ashley Schwartz was getting ready for discharge during the assessment.     Orientation: Orientated to person, place, time and situation.     Speech Pattern: Normal volume and tone.     Affect/Mood: Smiling, cheerful and positive     Impulsive/Potential for Harm: Denied SI/HI    Judgement/Insight: Both appear normal    Thought Processes/Reality Testing: Linear thoughts with no concerns.     Intellectual Functioning/Memory: Average intelligence with no apparent memory issues.        Social History: (e.g., living arrangements, social supports, quality of relationships with social supports, quality of support after discharge)  Ashley Schwartz resides at [address] in [place]. She lives there with her husband/FOB Ashley Schwartz. She explained that they have ample family support and Ashley Schwartz also has support from her  co-workers.      Employment/Educational History: (e.g., educational background, employment history, financial status, financial barriers, ability to access resources)  Both parents work full time. Ashley Schwartz works for [employer] and has a large support group from her employer. FOB works for a concrete business. They have applied for financial assistance but has been denied due to their income level. Ashley Schwartz plans to re-apply for WIC to see if she qualifies for benefits. They have all necessary supplies and denied needing anything from SW.     Cultural Values and Beliefs:  Not shared with SW.     Health Literacy: (e.g., ability to navigate relevant service systems, understanding of medical issue being treated, understanding of steps after discharge)  Ashley Schwartz was looking for a PCP for the baby on her own.      Patientâs understanding of services:  Ashley Schwartz had no questions or needs.      Strengths:  Ashley Schwartz has a strong support system and the resources to provide for her family.      Barriers:  Ashley Schwartz is a first time mother and learning about the needs of a newborn.      Other:  Father of Baby (FOB): Ashley Schwartz    FOB Date of Birth: 11/01/1990    Infant Name: Ashley Schwartz    Infant Weight: 7lb 10.1oz    Gestation: [redacted]w[redacted]d    Pediatrician: Looking for a PCP       Summary, Recommendations, and Plan:    Ashley Schwartz was referred to SW due to late prenatal care. Ashley Schwartz has all necessary supplies and denied needing anything from SW. There are no concerns for substance abuse or mental health. Ashley Schwartz is a first time parent and is looking for a pcp for the baby. SW has no concerns for the family at this time. Baby is cleared for discharge when medically ready.     Social Work Interventions:  Interventions: Consult    Time with Patient: 25 min

## 2021-11-17 NOTE — Unmapped (Signed)
This note was copied from a baby's chart.    LACTATION CONSULTATION    Name: Ashley Schwartz     MRN: [id number]         Date of birth: 11/16/2021  Time of Birth: 1:33 PM  Gestational age: Gestational Age: [redacted]w[redacted]d Birth Weight: 7 lb 10.1 oz (3461 g) Most Recent Weight: Weight: 3393 g (7 lb 7.7 oz)  Weight Change from Birth: -2%       Maternal Assessment:     Maternal Data:  Information for the patient's mother:  Latriece, Anstine [[id number]]   31 y.o.   Kathlyn [place]:   Information for the patient's mother:  Shavonta, Gossen [[id number]]   G1P1001    Information for the patient's mother:  Yvana, Samonte [[id number]]   [redacted]w[redacted]d       Breast Assessment:  Breasts are: Breasts not assessed this encounter   Infant Assessment:    DOL:21 hours    Feeding: Feeding Type: Mother's milk        I&O adequacy:  Urine output:  is established   Stool output:is established  Percent weight change from birthweight: -2%         Intervention during consultation:   Interventions performed: education    Latch & Positioning: MOB stated infant is latching well to the left side but has some trouble on the right. MOB stated infant does latch on the right side it just takes infant a lot longer. MOB stated she is not having any pain or discomfort when feeding. MOB provided insurance card to [facility] to send to pump company for pump coverage verification. Discussed with MOB that we are waiting for pump approval and when we get the information we will let her know. MOB stated she has had some issues with things getting covered.     Education: latch and positioning, feeding frequency and feeding cues, engorgement prevention, engorgement management, and when to call [facility]      Resource information given/ discussed with patient:  Post discharge breastfeeding resources provided- Caring for yourself and your newborn, Breastfeeding crib card  Resources for post discharge follow up with outpatient lactation and WIC (if appropriate)  [facility] lactation services for breastfeeding help  after discharge  See Discharge Instructions for more community lactation support and information.     Plan:     1. Breastfeed on cue and at least 8 times/24hrs, unlimited timing. May not nurse this often in the first 24 hours. Wake baby and offer breastfeeding if 3 hours since beginning of last feeding. Place infant skin to skin if infant will not breastfeed at 3 hours.   2. Hand express prior to latch to evert nipple and entice infant to breast.   3. It is important to use gentle stimulation during the feeding to promote active eating. Offer both breasts at every feeding. Burp infant in between sides. Alternate which breast is used first.   4. Offer STS often while awake. Mother holding infant skin to skin between feedings will promote milk supply and allow infant to rest more deeply.   5. Maintain a feeding log until infant is gaining weight and seen by primary care physician.  6. Request BF assistance from [facility] or RN as needed.    Feeding plan reviewed with: MOB    Mother's response: verbalized understanding of education and instruction, active in care, bonding well, and pleased

## 2021-11-17 NOTE — Unmapped (Signed)
Delivery Information for [name]    Labor Events:    Preterm labor: No   Labor onset date:     Labor onset time:     Antenatal steroids: None   Rupture date:     Rupture time:     Rupture type: Artificial   Fluid Color: Clear   Dilation complete date:     Dilation complete time:     Induction:     Induction indication:     Augmentation: AROM   Pushing Started:     Labor Complications None   Additional Complications:     Cervical ripening:            Antibiotics received during labor? No       Labor Length:    First Stage:   hours,   minutes    Second Stage:   hours,   minutes   Third Stage: 0 hours, 4 minutes       Delivery:    Episiotomy: None       Laceration Repaired?   Pitocin started within 60 seconds of delivery: Yes   Perineal: 1st Yes   Periurethral:       Labial:       Sulcus:       Vaginal:       Cervical:           Repair suture:     Repair # of packets:     Blood loss (ml):     Counts          Forceps attempted? No   Vacuum extractor attempted No   Indications:     Forceps application location:         Asst Del Details (If applicable): No data filed          Presentation/Position: Vertex; Right Occiput Anterior      Anesthesia:    Method:  Epidural   Analgesics:           Other Procedures: None    Birth information:  Date of birth: 11/16/2021   Time of birth: 1:33 PM   Sex: female   Delivery type: Vaginal, Spontaneous   Breech type (if applicable):     Observed anomalies/comments       Gestational Age: [redacted]w[redacted]d      Living? Living          APGARS  One minute Five minutes Ten minutes Fifteen minutes Twenty minutes   Skin color: 1   1             Heart rate: 2   2             Grimace: 2   2              Muscle tone: 2   2              Breathing: 2   2              Totals: 9   9                  Shoulder Dystocia:   Shoulder Dystocia    Shoulder dystocia present: No   Delivery of posterior arm:      Fetal clavicular fracture:      Gaskin maneuver:      McRoberts maneuver:      Rubin maneuver:       Suprapubic pressure:      Symphysiotomy:  Woods screw maneuver:      Zavanelli maneuver:                           Newborn Measurements:  Weight: 3461 g (7 lb 10.1 oz)    Length: 48.3 cm (19)    Head circumference (in): 13.976    Chest circumference (in):     Observed anomalies:     Date of Fetal Demise:   Weight: 122.1 oz Length: 19   Head circumference: 0.355 m           Cord Information: 3 vessels     Disposition of cord blood: Discarded    Blood gases sent? Yes  Complications: Nuchal   Cord clamped date/time:      Resuscitation: Suctioning     Placenta:  Delivered: 11/16/2021  1:37 PM  Appearance: Intact  Removal: Expressed    Disposition: morgue/discarded       Delivery Providers:    Delivery Clinician: Demaris Callander    Other Providers: Abram Sander, GABRIELLE;MCCOY, Ladene Artist, MADISON;MCWILLIAMS, [facility] Delivery Nurse;Delivery Nurse;Resident;Charge Nurse;Baby Nurse;Resident           [employer]  OB/GYN Discharge Summary     Patient: Ashley Schwartz  Age: 31 y.o.    MRN: [id number]   CSN: [id number]    Date of admission: 11/15/2021  Date of discharge: 11/17/2021    Attending: Dr. Donell Sievert     Diagnoses Present on Admission  1. IUP at [redacted]w[redacted]d    2. GHTN  3. Late prenatal care    Diagnosis Present on Discharge  1. IUP at [redacted]w[redacted]d    2. GHTN  3. Late prenatal care  4  Bilateral periclitoral lacerations s/p repair  5. 1st degree perineal laceration s/p repair    Procedures performed  1. Spontaneous vaginal delivery  2. Repair of bilateral periclitoral and 1st degree perineal lacerations.     Consultations  1. Lactation  2. Social work    Discharge medications:      Medication List      TAKE these medications, which are NEW      Quantity/Refills   acetaminophen 325 MG tablet  Commonly known as: TYLENOL  Take 2 tablets (650 mg total) by mouth every 6 hours as needed for Pain (alternate with ibuprofen).   Quantity: 120 tablet  Refills: 1     ibuprofen 600 MG tablet  Commonly  known as: MOTRIN  Take 1 tablet (600 mg total) by mouth every 6 hours as needed for Pain (alternate with acetaminophen).   Quantity: 60 tablet  Refills: 1        STOP taking these medications    famotidine 20 MG tablet  Commonly known as: PEPCID     ondansetron 4 MG tablet  Commonly known as: ZOFRAN           Where to Get Your Medications      These medications were sent to [id number] - [place], [place] - [address] AT [facility] [address]  [address], [place] [address]    Hours: 24-hours Phone: [phone]    acetaminophen 325 MG tablet   ibuprofen 600 MG tablet       Allergy:  No Known Allergies    Indications for admission  This patient is a 31 y.o. G1P1001 who presented at [redacted]w[redacted]d by 36 wk [place] admitted for IOL 2/2 GHTN diagnosis at term.  Hospital Course  1. Patient presented to L&D for IOL. Patient subsequently underwent an uncomplicated induction with Foley balloon @ 0200 7/19, misoprostol @ 0500 7/19, and AROM. She subsequently had a spontaneous vaginal delivery at 13:33 on 11/16/2021. Please see procedure note for details. Patient's postpartum course was uncomplicated.      GHTN: Diagnosed based on mild range blood pressures in clinic and on arrival to [facility] ED. Patient was therefore recommended for IOL and agreed. Patient was asymptomatic throughout her stay and HELLP labs were within normal limits on admission. Patient will need 1 week BP check. BP remained normal postpartum.    Late prenatal care: Patient was dated by 60 week [place] and initial prenatal visit was at 34 weeks. Patient was seen by [facility] social work.     Patient was able to ambulate, void without difficulty, tolerating regular diet, and pain well controlled with oral medications.  She was discharged on postpartum day 1 with instructions to return with heavy vaginal bleeding, uncontrolled abdominal pain, temperature >100.3, vaginal discharge, or other concerning signs or symptoms.  Patient will have  follow up in 3 weeks.      Rh status: pos  Rubella status: Immune  Birth control method: Interval paragard, declined bridge  Breastfeeding: plans to breastfeed    Labs:   Lab Results   Component Value Date    WBC 15.6 (H) 11/17/2021    HGB 8.5 (L) 11/17/2021    HCT 25.9 (L) 11/17/2021    MCV 85.4 11/17/2021    PLT 234 11/17/2021     Discharge Instructions  1. Condition on discharge - stable  2. Activity - pelvic rest x 6 weeks.    3. Diet - regular  4. Patient to return with uncontrolled abdominal pain, fevers >100.3, uncontrolled nausea/vomiting, vaginal bleeding soaking through 1 pad per hour x 2 hours, or any other concerning symptoms.  5. Follow up with OB at [facility] in 1 week for BP check, 3 weeks for postpartum visit, and again in 6-8 weeks for IUD placement.    Future Appointments   Date Time Provider Department Center   12/09/2021 12:40 PM Maggie Karl Ito, NP [facility]     Durward Fortes, MD  Obstetrics and Gynecology PGY-4

## 2021-11-17 NOTE — Unmapped (Signed)
Met with mom to introduce self and discharge case management program. Confirmed with mom that she has a car seat and crib for infant, and all needed baby supplies. Reinforced behaviors/practices that can decrease an infant's risk for sleep related deaths with mom, and verbal understanding received. Assessed transportation barriers for follow-up appointments after discharge. Transportation will be own car.  Plans for discharge today.  Mom states she has support at home from spouse. PHQ-9 completed; score = 0 RN Reviewed s/s of PPD and when patient should contact provider with concerns regarding change of mood. Reviewed Discharge RN Case Manager contact information, and encouraged to call with questions.         [facility] due to [facility]-19 restrictions. [facility] on 7/21 at 1:30pm and 7/24 at 12:00pm. Discharge summary for infant will be faxed to pediatric office at discharge.  [facility] scheduled for 7/27 at 1:00pm and 8/11 at 12:40pm.. Confirmed scheduled appointments with patient.          Discussed S/S of urgent maternal warning signs. Discussed when to call 911 and when to call the provider.    Informed patient to call 911 if they experience pain in their chest, obstructed breathing, seizures, or if they have thoughts of hurting themselves or their baby. Informed patient to notify the responders that they have given birth within the past year.    Educational handout provided, patient verbalized understanding.

## 2021-11-17 NOTE — Unmapped (Signed)
MD requested bladder scan for pt.  Scan results .  MD notified and said to place foley.  RN to bedside and informed pt.  Pt wanted to try to use the restroom again to see if she can pee more.  Pt was able to void .  Pt refused foley and stated she wants to continue to try to urinate on own.  MD notified and Pt informed she needs to void by 4am.  RN gave pt water.

## 2021-11-17 NOTE — Unmapped (Signed)
[facility] that pt has no breast pump coverage with insurance plan.    Called patient and left message on voicemail to inform no breast pump coverage. Encouraged to call lactation office or [employer] with additional questions. Phone numbers provided.

## 2021-11-22 ENCOUNTER — Ambulatory Visit: Payer: PRIVATE HEALTH INSURANCE

## 2021-11-22 ENCOUNTER — Institutional Professional Consult (permissible substitution): Admit: 2021-11-22 | Payer: PRIVATE HEALTH INSURANCE

## 2021-11-22 DIAGNOSIS — O9943 Diseases of the circulatory system complicating the puerperium: Secondary | ICD-10-CM

## 2021-11-22 NOTE — Unmapped (Signed)
Patient here for blood pressure check after delivery 134/68 in range. Will call if has symptoms and has next appointment scheduled.

## 2021-11-24 ENCOUNTER — Encounter

## 2021-12-09 ENCOUNTER — Other Ambulatory Visit: Admit: 2021-12-09 | Payer: PRIVATE HEALTH INSURANCE | Attending: Women's Health

## 2021-12-09 NOTE — Unmapped (Signed)
POSTPARTUM EXAM SUMMARY  Ashley Schwartz is a 31 y.o. G106P1001 female who presents for her postpartum visit.  She had a spontaneous vaginal delivery bilateral peri-clitoral lacerations and first degree laceration, s/p repair on 11/16/21.   Her history is significant for GHTN.  She had a baby boy who is doing well.      HPI:   Doing well today     BP normal today  Denies HA, vision changes, RUQ/epigastric pain, chest pain, SOB.    Any fevers, chills, night sweats: no    Problems with urination or bowel movements: no; initially had to take stool softener x1 following delivery, but hasn't had to and is having regular bowel movements    Vaginal bleeding: improved; off/on light to heavier bleeding like a period; passing small, stringy clots at times; changes pad 1-2 times/day    Pain: improved; takes tylenol/ibuprofen as needed    Denies laceration concerns     Mood/post-partum depression survey EPDS score: 0; denies concerns with mood     Method of Infant Feeding: breast; denies breast concerns    Infant (s) seen by pediatrics within 7 days of birth: yes     Sexually active since delivery: no    Chosen birth control method: Initially considering interval Paragard, now leaning more towards OCPs     Smoker/Tobacco use: no    OBJECTIVE:  BP 118/74 (BP Location: Right arm, Patient Position: Sitting, BP Cuff Size: Regular)   Pulse 72   Temp 98.2 F (36.8 C) (Oral)   Wt 195 lb 3.2 oz (88.5 kg)   LMP 02/12/2021 (Exact Date)   BMI 32.48 kg/m   Constitutional: alert, no acute distress, well hydrated, well nourished  Skin: normal color, no rashes, no unusual bruising, warm to touch  Gastrointestinal: Abdomen- nondistended, nontender, no masses  GU: patient declined     ASSESSMENT / PLAN:   1. Encounter for postpartum care and examination after delivery          - no e/o PPD or baby blues; EPDS 0   - Normal BP today - asymptomatic  - patient declined pelvic exam today  - Pap due at 6 week PPV  - planning OCPs for birth control  -  desires to start at 6 wk PPV  - breast feeding - denies breast concerns    RTC for 6 week PPV, prn concerns     Derrill Center, WHNP-BC   Ob/Gyn   12/11/2021

## 2021-12-29 ENCOUNTER — Other Ambulatory Visit: Admit: 2021-12-29 | Discharge: 2022-01-03 | Payer: PRIVATE HEALTH INSURANCE

## 2021-12-29 DIAGNOSIS — Z30011 Encounter for initial prescription of contraceptive pills: Secondary | ICD-10-CM

## 2021-12-29 LAB — POC HCG QUALITATIVE, URINE: hCG Qualitative -Clinitek: NEGATIVE

## 2021-12-29 MED ORDER — norethindrone-ethinyl estradiol (MICROGESTIN 1/20) 1-20 mg-mcg per tablet (21 tabs)
1-20 | PACK | Freq: Every day | ORAL | 3 refills | 84.00000 days | Status: AC
Start: 2021-12-29 — End: ?

## 2021-12-29 NOTE — Unmapped (Signed)
CC: contraception visit      S:  Has used condoms in the past.  Desires to start COC.  Has not used them in the past. Desires method she can control- aware of LARCs, but declines.     Breastfeeding.  No problems, good supply.   Has not had sex since delivery.     Prior to pregnancy menses is monthly moderate flow no dysmenorrhea    PMHx- confirmed no history of migraines, DVT/PE.  Non smoker.      O:  BP 122/72 (BP Location: Right upper arm, Patient Position: Sitting, BP Cuff Size: Regular)   Pulse 65   Temp 98.3 F (36.8 C) (Oral)   Ht 5' 5 (1.651 m)   Wt 196 lb (88.9 kg)   LMP 02/12/2021 (Exact Date)   BMI 32.62 kg/m   Gen: NAD  Pulm: Unlabored  Rest of exam deferred      A/P: Breastfeeding and abstinent since delivery          > 6 weeks from delivery          OK for quick start           Reviewed how to take medication and possible side effects, written information given as well          Follow up for annual or prn    Donell Sievert, MD
